# Patient Record
Sex: Female | Born: 1985 | Hispanic: No | Smoking: Never smoker
Health system: Southern US, Community
[De-identification: ages and names within clinical notes are randomized; demographics above are authoritative.]

## PROBLEM LIST (undated history)

## (undated) DIAGNOSIS — Z789 Other specified health status: Secondary | ICD-10-CM

## (undated) HISTORY — PX: WISDOM TOOTH EXTRACTION: SHX21

## (undated) HISTORY — PX: OTHER SURGICAL HISTORY: SHX169

---

## 2014-08-24 LAB — OB RESULTS CONSOLE HEPATITIS B SURFACE ANTIGEN
Hepatitis B Surface Ag: NEGATIVE
Hepatitis B Surface Ag: NEGATIVE

## 2014-08-24 LAB — OB RESULTS CONSOLE RUBELLA ANTIBODY, IGM: Rubella: IMMUNE

## 2014-08-24 LAB — OB RESULTS CONSOLE ABO/RH: RH Type: POSITIVE

## 2014-08-24 LAB — OB RESULTS CONSOLE ANTIBODY SCREEN: Antibody Screen: NEGATIVE

## 2014-08-24 LAB — OB RESULTS CONSOLE HIV ANTIBODY (ROUTINE TESTING): HIV: NONREACTIVE

## 2014-08-24 LAB — OB RESULTS CONSOLE GC/CHLAMYDIA: CHLAMYDIA, DNA PROBE: NEGATIVE

## 2014-08-25 LAB — OB RESULTS CONSOLE GC/CHLAMYDIA: Gonorrhea: NEGATIVE

## 2014-10-27 NOTE — L&D Delivery Note (Signed)
Delivery Note  First Stage: Labor onset: 0500 Augmentation: none Analgesia /Anesthesia intrapartum: water immersion SROM at 1211  Second Stage: Entered tub @1145 , water temp 100.0 Complete dilation at 1158 Onset of pushing at 1158 FHR second stage 120, cat I  In maternal hands and knees, delivery of a viable female at 68 by CNM in OA position in water No nuchal cord Cord double clamped after cessation of pulsation, cut by FOB Cord blood sample collected   Collection of cord blood donation n/a Arterial cord blood sample n/a  Third Stage: Placenta delivered via Tomasa Blase intact with 3 VC @ 1218 Placenta disposition: routine disposal Uterine tone firm / bleeding small  2nd degree perineal laceration identified  Anesthesia for repair: local Repaired with 2-0 Vicryl rapide Est. Blood Loss (mL): 200  Complications: none  Mom to postpartum.  Baby to Couplet care / Skin to Skin.  Newborn: Birth Weight: 7-15 Apgar Scores: 8/9 Feeding planned: breast  Donette Larry, N MSN, CNM 04/13/2015, 1:56 PM

## 2014-12-12 ENCOUNTER — Inpatient Hospital Stay (HOSPITAL_COMMUNITY): Admission: AD | Admit: 2014-12-12 | Payer: Self-pay | Source: Ambulatory Visit | Admitting: Obstetrics and Gynecology

## 2015-01-10 LAB — OB RESULTS CONSOLE RPR
RPR: NONREACTIVE
RPR: NONREACTIVE

## 2015-01-12 LAB — OB RESULTS CONSOLE RPR: RPR: NONREACTIVE

## 2015-03-06 LAB — OB RESULTS CONSOLE GBS: STREP GROUP B AG: NEGATIVE

## 2015-04-13 ENCOUNTER — Inpatient Hospital Stay (HOSPITAL_COMMUNITY)
Admission: AD | Admit: 2015-04-13 | Discharge: 2015-04-14 | DRG: 775 | Disposition: A | Discharge status: Home / Self Care | Source: Ambulatory Visit | Attending: Obstetrics and Gynecology | Admitting: Obstetrics and Gynecology

## 2015-04-13 ENCOUNTER — Encounter (HOSPITAL_COMMUNITY): Payer: Self-pay | Admitting: *Deleted

## 2015-04-13 DIAGNOSIS — O480 Post-term pregnancy: Secondary | ICD-10-CM | POA: Diagnosis present

## 2015-04-13 DIAGNOSIS — Z3A41 41 weeks gestation of pregnancy: Secondary | ICD-10-CM | POA: Diagnosis present

## 2015-04-13 HISTORY — DX: Other specified health status: Z78.9

## 2015-04-13 MED ORDER — MEASLES, MUMPS & RUBELLA VAC ~~LOC~~ INJ: 0.5000 mL | INJECTION | Freq: Once | SUBCUTANEOUS | Status: DC

## 2015-04-13 MED ORDER — OXYCODONE-ACETAMINOPHEN 5-325 MG PO TABS
2.0000 | ORAL_TABLET | ORAL | Status: DC | PRN
Start: 2015-04-13 — End: 2015-04-14

## 2015-04-13 MED ORDER — WITCH HAZEL-GLYCERIN EX PADS: 1.0000 "application " | MEDICATED_PAD | CUTANEOUS | Status: DC | PRN

## 2015-04-13 MED ORDER — ONDANSETRON HCL 4 MG/2ML IJ SOLN: 4.0000 mg | INTRAMUSCULAR | Status: DC | PRN

## 2015-04-13 MED ORDER — LANOLIN HYDROUS EX OINT: TOPICAL_OINTMENT | CUTANEOUS | Status: DC | PRN

## 2015-04-13 MED ORDER — ZOLPIDEM TARTRATE 5 MG PO TABS: 5.0000 mg | ORAL_TABLET | Freq: Every evening | ORAL | Status: DC | PRN

## 2015-04-13 MED ORDER — CITRIC ACID-SODIUM CITRATE 334-500 MG/5ML PO SOLN: 30.0000 mL | ORAL | Status: DC | PRN

## 2015-04-13 MED ORDER — LACTATED RINGERS IV SOLN: 500.0000 mL | INTRAVENOUS | Status: DC | PRN

## 2015-04-13 MED ORDER — OXYTOCIN 10 UNIT/ML IJ SOLN
INTRAMUSCULAR | Status: AC
  Administered 2015-04-13: 10 [IU]
  Filled 2015-04-13: qty 1

## 2015-04-13 MED ORDER — SENNOSIDES-DOCUSATE SODIUM 8.6-50 MG PO TABS
2.0000 | ORAL_TABLET | ORAL | Status: DC
  Filled 2015-04-13: qty 2

## 2015-04-13 MED ORDER — DIPHENHYDRAMINE HCL 25 MG PO CAPS: 25.0000 mg | ORAL_CAPSULE | Freq: Four times a day (QID) | ORAL | Status: DC | PRN

## 2015-04-13 MED ORDER — DIBUCAINE 1 % RE OINT: 1.0000 "application " | TOPICAL_OINTMENT | RECTAL | Status: DC | PRN

## 2015-04-13 MED ORDER — TETANUS-DIPHTH-ACELL PERTUSSIS 5-2.5-18.5 LF-MCG/0.5 IM SUSP: 0.5000 mL | Freq: Once | INTRAMUSCULAR | Status: DC

## 2015-04-13 MED ORDER — OXYTOCIN BOLUS FROM INFUSION: 500.0000 mL | INTRAVENOUS | Status: DC

## 2015-04-13 MED ORDER — PRENATAL MULTIVITAMIN CH
1.0000 | ORAL_TABLET | Freq: Every day | ORAL | Status: DC
  Filled 2015-04-13: qty 1

## 2015-04-13 MED ORDER — IBUPROFEN 600 MG PO TABS
600.0000 mg | ORAL_TABLET | Freq: Four times a day (QID) | ORAL | Status: DC
  Administered 2015-04-13 – 2015-04-14 (×3): 600 mg via ORAL
  Filled 2015-04-13 (×4): qty 1

## 2015-04-13 MED ORDER — ONDANSETRON HCL 4 MG PO TABS: 4.0000 mg | ORAL_TABLET | ORAL | Status: DC | PRN

## 2015-04-13 MED ORDER — ACETAMINOPHEN 325 MG PO TABS: 650.0000 mg | ORAL_TABLET | ORAL | Status: DC | PRN

## 2015-04-13 MED ORDER — SIMETHICONE 80 MG PO CHEW: 80.0000 mg | CHEWABLE_TABLET | ORAL | Status: DC | PRN

## 2015-04-13 MED ORDER — ONDANSETRON HCL 4 MG/2ML IJ SOLN: 4.0000 mg | Freq: Four times a day (QID) | INTRAMUSCULAR | Status: DC | PRN

## 2015-04-13 MED ORDER — LIDOCAINE HCL (PF) 1 % IJ SOLN
30.0000 mL | INTRAMUSCULAR | Status: DC | PRN
  Administered 2015-04-13: 30 mL via SUBCUTANEOUS
  Filled 2015-04-13: qty 30

## 2015-04-13 MED ORDER — BENZOCAINE-MENTHOL 20-0.5 % EX AERO
1.0000 "application " | INHALATION_SPRAY | CUTANEOUS | Status: DC | PRN
  Administered 2015-04-13: 1 via TOPICAL
  Filled 2015-04-13: qty 56

## 2015-04-13 MED ORDER — OXYTOCIN 40 UNITS IN LACTATED RINGERS INFUSION - SIMPLE MED: 62.5000 mL/h | INTRAVENOUS | Status: DC

## 2015-04-13 MED ORDER — OXYCODONE-ACETAMINOPHEN 5-325 MG PO TABS: 1.0000 | ORAL_TABLET | ORAL | Status: DC | PRN

## 2015-04-13 MED ORDER — OXYTOCIN 10 UNIT/ML IJ SOLN: 10.0000 [IU] | Freq: Once | INTRAMUSCULAR | Status: DC

## 2015-04-13 NOTE — Progress Notes (Signed)
In tub

## 2015-04-13 NOTE — MAU Note (Signed)
41+ wks, contractions started around 0500, contractions now about every 3 min.  Had some bloody show, no leaking. Was 2 cm on Tues.

## 2015-04-13 NOTE — H&P (Addendum)
  OB ADMISSION/ HISTORY & PHYSICAL:  Admission Date: 04/13/2015  9:45 AM  Admit Diagnosis: 41.[redacted] weeks gestation, active labor  Cynthia Briggs is a 29 y.o. female presenting for regular painful ctx since 0500.  Prenatal History: G2P1001   EDC:04/05/2015, Date entered prior to episode creation   Transfer of prenatal care at 14 wks to [facility]  Primary Ob Provider: Marlinda Mike, CNM Prenatal course complicated by post dates-normal AT, normal AFI, EFW 7-10, and IDA.   Prenatal Labs: ABO, Rh:   A Pos Antibody:  Neg Rubella:   Immune RPR:   NR HBsAg:   Neg HIV:   Neg GBS:   Neg 1 hr GTT: declined-home monitoring WNL  Medical / Surgical History :  Past medical history:  Past Medical History  Diagnosis Date  . Medical history non-contributory      Past surgical history:  Past Surgical History  Procedure Laterality Date  . Wart removed    . Wisdom tooth extraction      Family History: History reviewed. No pertinent family history.   Social History:  reports that she has never smoked. She has never used smokeless tobacco. She reports that she does not drink alcohol or use illicit drugs.  Allergies: Codeine   Current Medications at time of admission:  Prior to Admission medications   Medication Sig Start Date End Date Taking? Authorizing Provider  benzoyl peroxide 10 % gel Apply 1 application topically daily.   Yes Historical Provider, MD  prenatal vitamin w/FE, FA (PRENATAL 1 + 1) 27-1 MG TABS tablet Take 1 tablet by mouth daily at 12 noon.   Yes Historical Provider, MD     Review of Systems: +FM +ctx +bloody show +BMs No LOF  Physical Exam:  VS: Blood pressure 127/74, pulse 80, temperature 98 F (36.7 C), temperature source Oral, resp. rate 18, height 5' 4.5" (1.638 m), weight 77.565 kg (171 lb).  General: alert and oriented, appears moderately uncomfortable with ctx Heart: RRR Lungs: Clear lung fields Abdomen: Gravid, soft and  non-tender, non-distended / uterus: gravid, non-tender Extremities: No edema Genitalia / VE: Dilation: 4 Effacement (%): 90 Station: -2 Exam by:: K,. Weiss RN FHR: baseline rate 125 / variability mod / accelerations + / no decelerations TOCO: 3-4, mod  Assessment: 41.[redacted] weeks gestation First stage of labor-early active FHR category I  Plan:  Admit, intermittent EFM, water immersion when more active, labor support with doula, consider AROM, anticipate SVD. Dr. Billy Coast notified of admission / plan of care   Lawernce Pitts MSN, CNM 04/13/2015, 10:59 AM

## 2015-04-13 NOTE — Progress Notes (Signed)
Water birth, pt laborinig in BR now

## 2015-04-14 LAB — CBC
HEMATOCRIT: 30.6 % — AB (ref 36.0–46.0)
HEMOGLOBIN: 10.7 g/dL — AB (ref 12.0–15.0)
MCH: 31 pg (ref 26.0–34.0)
MCHC: 35 g/dL (ref 30.0–36.0)
MCV: 88.7 fL (ref 78.0–100.0)
Platelets: 177 10*3/uL (ref 150–400)
RBC: 3.45 MIL/uL — ABNORMAL LOW (ref 3.87–5.11)
RDW: 13 % (ref 11.5–15.5)
WBC: 9.5 10*3/uL (ref 4.0–10.5)

## 2015-04-14 NOTE — Discharge Instructions (Signed)
Ok to use OTC Tylenol and Motrin for discomfort and pain - recommend Motrin 3 tablets x 48 hr after DC then PRN

## 2015-04-14 NOTE — Progress Notes (Signed)
Called lab to inquire about RPR results, since I could not see RPR under pending results. When I got report this am, from Cynthia Briggs, she states that she added RPR onto CBC draw this am and lab said to cancel because one was already pending.  I spoke to lab and they say that both orders for RPR are cancelled and they cannot see one pending. They will look into it further and call me back if I need to order another RPR.

## 2015-04-14 NOTE — Progress Notes (Signed)
Spoke with Cynthia Briggs in lab both orders for RPR have been cancelled and they do not have any blood on hold in lab they can use to run RPR.  Need new order for RPR and will have to restick pt.

## 2015-04-14 NOTE — Progress Notes (Signed)
PPD 1 SVD  S:  Reports feeling well - desires early DC today             Tolerating po/ No nausea or vomiting             Bleeding is moderate             Pain controlled with motrin only - declines percocet use             Up ad lib / ambulatory / voiding QS  Newborn breast feeding  / female O:               VS: BP 120/62 mmHg  Pulse 59  Temp(Src) 98.2 F (36.8 C) (Oral)  Resp 18  Ht 5' 4.5" (1.638 m)  Wt 77.565 kg (171 lb)  BMI 28.91 kg/m2  Breastfeeding? Unknown   LABS:              Recent Labs  04/14/15 0552  WBC 9.5  HGB 10.7*  PLT 177               Blood type: A/Positive/-- (10/29 0000)  Rubella: Immune (10/29 0000)                     I&O: Intake/Output      06/17 0701 - 06/18 0700 06/18 0701 - 06/19 0700   Urine (mL/kg/hr) 0    Blood 125    Total Output 125     Net -125          Urine Occurrence 1 x                  Physical Exam:             Alert and oriented X3  Abdomen: soft, non-tender, non-distended              Fundus: firm, non-tender, U-1  Perineum: no edema  Lochia: light  Extremities: no edema, no calf pain or tenderness    A: PPD # 1 s/p waterbirth   Doing well - stable status  P: Routine post partum orders - DC home after 24hr PP  Draw RPR prior to discharge - patient aware of lab re-draw needed to meet state requirements of RPR              WOB booklet - instructions reviewed  Marlinda Mike CNM, MSN, [facility] 04/14/2015, 10:46 AM

## 2015-04-14 NOTE — Discharge Summary (Signed)
Obstetric Discharge Summary  Reason for Admission: onset of labor @ 41.1 weeks Prenatal Procedures: NST and ultrasound - post dates protocol Intrapartum Procedures: spontaneous vaginal delivery - water birth Postpartum Procedures: none Complications-Operative and Postpartum: 2nd degree perineal laceration HEMOGLOBIN  Date Value Ref Range Status  04/14/2015 10.7* 12.0 - 15.0 g/dL Final   HCT  Date Value Ref Range Status  04/14/2015 30.6* 36.0 - 46.0 % Final    Physical Exam:  General: alert, cooperative and no distress Lochia: appropriate Uterine Fundus: firm Incision: healing well DVT Evaluation: No evidence of DVT seen on physical exam.  Discharge Diagnoses: Term Pregnancy-delivered - water birth  Discharge Information: Date: 04/14/2015 Activity: pelvic rest Diet: routine Medications: PNV, Ibuprofen and Tylenol Condition: stable Instructions: refer to practice specific booklet Discharge to: home Follow-up Information    Follow up with Marlinda Mike, CNM. Schedule an appointment as soon as possible for a visit in 6 weeks.   Specialty:  Obstetrics and Gynecology   Contact information:   [address] [place] [address] [phone]       Newborn Data: Live born female  Birth Weight: 7 lb 15.5 oz (3615 g) APGAR: 8, 9  Home with mother.  Marlinda Mike 04/14/2015, 10:50 AM

## 2015-04-14 NOTE — Lactation Note (Signed)
This note was copied from the chart of Cynthia Briggs. Lactation Consultation Note  Initial visit made.  Breastfeeding consultation services and support information given and reviewed with patient.  Mom is experienced breastfeeding her first baby.  Newborn latched immediately after birth and has been nursing frequently and well.  Good latch and active nursing observed at this visit.  Reviewed basics.  Outpatient lactation services and support encouraged prn.  Patient Name: Cynthia Briggs [id number] Date: 04/14/2015 Reason for consult: Initial assessment   Maternal Data    Feeding Feeding Type: Breast Fed  LATCH Score/Interventions Latch: Grasps breast easily, tongue down, lips flanged, rhythmical sucking.  Audible Swallowing: A few with stimulation  Type of Nipple: Everted at rest and after stimulation  Comfort (Breast/Nipple): Soft / non-tender     Hold (Positioning): No assistance needed to correctly position infant at breast. Intervention(s): Breastfeeding basics reviewed;Support Pillows;Skin to skin  LATCH Score: 9  Lactation Tools Discussed/Used     Consult Status Consult Status: Complete    Huston Foley 04/14/2015, 12:26 PM

## 2015-04-15 LAB — RPR: RPR: NONREACTIVE

## 2017-05-25 ENCOUNTER — Other Ambulatory Visit (HOSPITAL_COMMUNITY): Payer: Self-pay

## 2017-05-25 DIAGNOSIS — Z3689 Encounter for other specified antenatal screening: Secondary | ICD-10-CM

## 2017-05-27 ENCOUNTER — Encounter (HOSPITAL_COMMUNITY): Payer: Self-pay | Admitting: *Deleted

## 2017-05-29 ENCOUNTER — Ambulatory Visit (HOSPITAL_COMMUNITY)
Admission: RE | Admit: 2017-05-29 | Discharge: 2017-05-29 | Disposition: A | Discharge status: Home / Self Care | Source: Ambulatory Visit

## 2017-05-29 ENCOUNTER — Encounter (HOSPITAL_COMMUNITY): Payer: Self-pay

## 2017-05-29 ENCOUNTER — Other Ambulatory Visit (HOSPITAL_COMMUNITY): Payer: Self-pay

## 2017-05-29 ENCOUNTER — Other Ambulatory Visit (HOSPITAL_COMMUNITY): Payer: Self-pay | Admitting: *Deleted

## 2017-05-29 DIAGNOSIS — O358XX0 Maternal care for other (suspected) fetal abnormality and damage, not applicable or unspecified: Secondary | ICD-10-CM

## 2017-05-29 DIAGNOSIS — Z3A24 24 weeks gestation of pregnancy: Secondary | ICD-10-CM

## 2017-05-29 DIAGNOSIS — Z3689 Encounter for other specified antenatal screening: Secondary | ICD-10-CM

## 2017-05-29 DIAGNOSIS — O359XX0 Maternal care for (suspected) fetal abnormality and damage, unspecified, not applicable or unspecified: Secondary | ICD-10-CM | POA: Diagnosis present

## 2017-05-29 DIAGNOSIS — O289 Unspecified abnormal findings on antenatal screening of mother: Secondary | ICD-10-CM | POA: Insufficient documentation

## 2017-05-29 DIAGNOSIS — O365910 Maternal care for other known or suspected poor fetal growth, first trimester, not applicable or unspecified: Secondary | ICD-10-CM

## 2017-05-29 DIAGNOSIS — O365990 Maternal care for other known or suspected poor fetal growth, unspecified trimester, not applicable or unspecified: Secondary | ICD-10-CM

## 2017-05-29 DIAGNOSIS — O35EXX0 Maternal care for other (suspected) fetal abnormality and damage, fetal genitourinary anomalies, not applicable or unspecified: Secondary | ICD-10-CM

## 2017-05-29 DIAGNOSIS — O365920 Maternal care for other known or suspected poor fetal growth, second trimester, not applicable or unspecified: Secondary | ICD-10-CM | POA: Insufficient documentation

## 2017-05-29 NOTE — Progress Notes (Signed)
Genetic Counseling  Visit Summary Note  Appointment Date: 05/29/2017 Referred By: Juliene Pina, CNM  Date of Birth: Apr 18, 1986  Pregnancy history: G3P2002 Estimated Date of Delivery: 09/17/17 Estimated Gestational Age: [redacted]w[redacted]d I met with Mrs. Cynthia Briggs her husband, Cynthia Briggs for genetic counseling because of abnormal ultrasound findings.  In summary:  Discussed ultrasound findings in detail  Reviewed options for additional screening  NIPS - will consider  Expanded carrier screening - will consider  Ongoing ultrasound - scheduled  Reviewed options for diagnostic testing, including risks, benefits, limitations and alternatives  Reviewed other explanations for ultrasound findings  Reviewed family history concerns - none reported  Cynthia Briggs had an anatomy ultrasound today which revealed fetal measurements that are consistently small, suggestive of intrauterine growth restriction (IUGR).  The long bones are all small, but show no evidence of fractures or bowing.  The bone echogenicity is normal.   There is no evidence of other fetal anomalies or soft markers.  They were counseled that IUGR can result from a variety of causes, including chronic uteroplacental insufficiency, environmental exposures (drugs, alcohol, and other teratogens), congenital infections, or genetic etiologies.  Cynthia Briggs reports no history of exposures, or medical concerns during the pregnancy.  We discussed that uteroplacental insufficiency or poor placentation is a common cause of IUGR.  When the growth restriction is due to nutritional compromise, the head growth tends to be spared, resulting in asymmetrical growth restriction.  Ultrasound showed that the fetal head circumference is also small.  Regarding genetic etiologies, we discussed the increased risk for specific chromosome conditions including fetal aneuploidy.  We reviewed chromosomes, nondisjunction, and the common features of Down  syndrome, trisomy 187 trisomy 142and triploidy.  In addition, we discussed the slight increase in risk for other chromosome aberrations (uniparental disomy, microdeletions, duplications, insertions, and translocations).      We discussed the options of testing for fetal chromosome conditions by way of amniocentesis, including the risks, benefits and limitations.  We also discussed the option of noninvasive prenatal screening (NIPS).  We discussed that NIPS analyzes cell free fetal DNA found in the maternal circulation and provides a pregnancy specific risk assessment for specific chromosome conditions, including: trisomy 244 trisomy 131 trisomy 168 triploidy, and sex chromosome aneuploidy.  This test is not diagnostic for chromosome conditions; however, the reported detection rate is greater than 99% for most conditions and the false positive rate is less than 0.1 for all of these conditions.  After thoughtful consideration of her options, [name]  She may pursue NIPS at a follow up visit.    We then discussed other possible explanations for the above discussed ultrasound findings including single gene conditions.   They were counseled that IUGR can be a feature in a variety of genetic conditions, but can also be a normal variant of growth or the result of familial short stature.  There are no known individuals on either side of the family with short stature or specific genetic conditions.  We discussed the option of expanded carrier screening. We reviewed that expanded carrier screen evaluates carrier status for a wide range of genetic conditions. Some of these conditions are severe and actionable, but also rare; others occur more commonly, but may be less severe. We discussed that this panel includes 175 autosomal recessive or X-linked recessive genetic conditions. We reviewed that the prevalence of each condition varies (and often varies with ethnicity).  Thus, an  individual's background risk to be  a carrier for each of these conditions would range, and in some cases be very low or unknown. Similarly, the detection rate varies with each condition depending upon the number of genes implicated in the condition, for which testing may be available.  Thus, even though the majority of the genes are sequenced which typically can detect 99% of mutations, screening may not be available for all genes implicated in each condition.    The detection rate varies in some cases with ethnicity, ranging from greater than 99% (in the case of hemoglobinopathies) to unknown. We reviewed that a negative carrier screen would thus reduce, but not eliminate the chance to be a carrier for these conditions.  We reviewed that in the event that one partner is found to be a carrier for one or more conditions, carrier screening would be available to the other partner for those conditions, or for the entire panel.  We discussed that some couples choose to have one partner tested first and others choose to test both members of the couple at the same time. Finally, we discussed that while typically there are only reproductive implications to a positive screen, we may diagnose an asymptomatic affected individual with two mutations.  Additionally, premutation carriers for Fragile X syndrome may have an increased risk for premature ovarian failure and/or fragile X associated tremor and ataxia syndrome.  Thus, carriers could have clinical symptoms.  We discussed the possible results that the tests might provide including: positive, negative or unanticipated/unclear. Finally, she was counseled regarding the cost of the screening options and potential out of pocket expenses.  We discussed that we would provide insurance information to the referring laboratory. She will receive either a text message, an email or both from the referring laboratory regarding an estimate of the out of pocket expenses.  If they are  not comfortable with what that cost is, they can discuss payment options with the lab directly or cancel the test.  Cynthia Briggs will consider this option and let us know at a follow up visit if she wishes to pursue expanded carrier screening.  Both family histories were reviewed and found to be noncontributory for birth defects, mental retardation, and known genetic conditions. Without further information regarding the provided family history, an accurate genetic risk cannot be calculated. Further genetic counseling is warranted if more information is obtained.  I counseled this couple regarding the above risks and available options.  The approximate face-to-face time with the genetic counselor was 60 minutes.   Cam Hai, MS Certified Genetic Counselor

## 2017-06-05 ENCOUNTER — Encounter (HOSPITAL_COMMUNITY): Payer: Self-pay

## 2017-06-05 ENCOUNTER — Ambulatory Visit (HOSPITAL_COMMUNITY)
Admission: RE | Admit: 2017-06-05 | Discharge: 2017-06-05 | Disposition: A | Discharge status: Home / Self Care | Source: Ambulatory Visit

## 2017-06-05 DIAGNOSIS — O365920 Maternal care for other known or suspected poor fetal growth, second trimester, not applicable or unspecified: Secondary | ICD-10-CM | POA: Insufficient documentation

## 2017-06-05 DIAGNOSIS — Z3A25 25 weeks gestation of pregnancy: Secondary | ICD-10-CM | POA: Insufficient documentation

## 2017-06-05 DIAGNOSIS — O365990 Maternal care for other known or suspected poor fetal growth, unspecified trimester, not applicable or unspecified: Secondary | ICD-10-CM

## 2017-06-12 ENCOUNTER — Ambulatory Visit (HOSPITAL_COMMUNITY)
Admission: RE | Admit: 2017-06-12 | Discharge: 2017-06-12 | Disposition: A | Discharge status: Home / Self Care | Source: Ambulatory Visit

## 2017-06-12 ENCOUNTER — Encounter (HOSPITAL_COMMUNITY): Payer: Self-pay

## 2017-06-12 DIAGNOSIS — O365990 Maternal care for other known or suspected poor fetal growth, unspecified trimester, not applicable or unspecified: Secondary | ICD-10-CM

## 2017-06-12 DIAGNOSIS — O321XX0 Maternal care for breech presentation, not applicable or unspecified: Secondary | ICD-10-CM | POA: Diagnosis not present

## 2017-06-12 DIAGNOSIS — O365930 Maternal care for other known or suspected poor fetal growth, third trimester, not applicable or unspecified: Secondary | ICD-10-CM | POA: Diagnosis present

## 2017-06-12 DIAGNOSIS — Z3A26 26 weeks gestation of pregnancy: Secondary | ICD-10-CM | POA: Diagnosis not present

## 2017-06-19 ENCOUNTER — Encounter (HOSPITAL_COMMUNITY): Payer: Self-pay

## 2017-06-19 ENCOUNTER — Ambulatory Visit (HOSPITAL_COMMUNITY)
Admission: RE | Admit: 2017-06-19 | Discharge: 2017-06-19 | Disposition: A | Discharge status: Home / Self Care | Source: Ambulatory Visit

## 2017-06-19 DIAGNOSIS — O358XX0 Maternal care for other (suspected) fetal abnormality and damage, not applicable or unspecified: Secondary | ICD-10-CM | POA: Diagnosis not present

## 2017-06-19 DIAGNOSIS — O321XX0 Maternal care for breech presentation, not applicable or unspecified: Secondary | ICD-10-CM | POA: Insufficient documentation

## 2017-06-19 DIAGNOSIS — O365920 Maternal care for other known or suspected poor fetal growth, second trimester, not applicable or unspecified: Secondary | ICD-10-CM | POA: Diagnosis not present

## 2017-06-19 DIAGNOSIS — O365990 Maternal care for other known or suspected poor fetal growth, unspecified trimester, not applicable or unspecified: Secondary | ICD-10-CM

## 2017-06-19 DIAGNOSIS — Z3A27 27 weeks gestation of pregnancy: Secondary | ICD-10-CM | POA: Diagnosis not present

## 2017-06-20 LAB — CMV ANTIBODY, IGG (EIA): CMV Ab - IgG: 8.8 U/mL — ABNORMAL HIGH (ref 0.00–0.59)

## 2017-06-20 LAB — CMV IGM

## 2017-06-22 ENCOUNTER — Other Ambulatory Visit (HOSPITAL_COMMUNITY): Payer: Self-pay | Admitting: *Deleted

## 2017-06-22 DIAGNOSIS — O365930 Maternal care for other known or suspected poor fetal growth, third trimester, not applicable or unspecified: Secondary | ICD-10-CM

## 2017-06-26 ENCOUNTER — Encounter (HOSPITAL_COMMUNITY): Payer: Self-pay

## 2017-06-26 ENCOUNTER — Ambulatory Visit (HOSPITAL_COMMUNITY)
Admission: RE | Admit: 2017-06-26 | Discharge: 2017-06-26 | Disposition: A | Discharge status: Home / Self Care | Source: Ambulatory Visit

## 2017-06-26 ENCOUNTER — Other Ambulatory Visit (HOSPITAL_COMMUNITY): Payer: Self-pay | Admitting: Maternal & Fetal Medicine

## 2017-06-26 DIAGNOSIS — O365930 Maternal care for other known or suspected poor fetal growth, third trimester, not applicable or unspecified: Secondary | ICD-10-CM | POA: Diagnosis present

## 2017-06-26 DIAGNOSIS — Z3A28 28 weeks gestation of pregnancy: Secondary | ICD-10-CM | POA: Diagnosis not present

## 2017-06-26 DIAGNOSIS — O359XX0 Maternal care for (suspected) fetal abnormality and damage, unspecified, not applicable or unspecified: Secondary | ICD-10-CM | POA: Diagnosis not present

## 2017-06-30 ENCOUNTER — Telehealth (HOSPITAL_COMMUNITY): Payer: Self-pay | Admitting: MS"

## 2017-06-30 NOTE — Telephone Encounter (Signed)
Attempted to contact patient regarding results of noninvasive prenatal screening (NIPS)/prenatal cell free DNA testing ([facility]), which are within normal limits. Left message for patient to return call.   Clydie BraunKaren Quamir Willemsen 06/30/2017 11:51 AM

## 2017-07-01 ENCOUNTER — Telehealth (HOSPITAL_COMMUNITY): Payer: Self-pay | Admitting: Genetics

## 2017-07-01 NOTE — Telephone Encounter (Signed)
Called Ms. Diener to review the results of her NIPS.  There was no answer, I left a message with the direct phone number for her to return my call.

## 2017-07-02 ENCOUNTER — Telehealth (HOSPITAL_COMMUNITY): Payer: Self-pay | Admitting: MS"

## 2017-07-02 NOTE — Telephone Encounter (Signed)
Called Cynthia Briggs to discuss her prenatal cell free DNA test results.  Mrs. Cynthia Briggs had PaRozanna Boxnorama testing through [facility].  Testing was offered because of ultrasound findings.   The patient was identified by name and DOB.  We reviewed that these are within normal limits, showing a less than 1 in 10,000 risk for trisomies 21, 18 and 13, and monosomy X (Turner syndrome).  In addition, the risk for triploidy and sex chromosome trisomies (47,XXX and 47,XXY) was also low risk.  Analysis for microdeletion syndrome panel (22q11.2 deletion, 1p36 deletion, Angelman syndrome, Prader-Willi syndrome, and cri-du-chat syndrome) were also within normal limits.   We reviewed that this testing identifies > 99% of pregnancies with trisomy 4221, trisomy 1413, sex chromosome trisomies (47,XXX and 47,XXY), and triploidy. The detection rate for trisomy 18 is 96%.  The detection rate for monosomy X is ~92%.  The false positive rate is <0.1% for all conditions. Testing was also consistent with female fetal sex.  She understands that this testing does not identify all genetic conditions.  All questions were answered to her satisfaction, she was encouraged to call with additional questions or concerns.  Quinn PlowmanKaren Camarion Weier, MS Certified Genetic Counselor 07/02/2017 8:56 AM

## 2017-07-03 ENCOUNTER — Ambulatory Visit (HOSPITAL_COMMUNITY)
Admission: RE | Admit: 2017-07-03 | Discharge: 2017-07-03 | Disposition: A | Discharge status: Home / Self Care | Source: Ambulatory Visit

## 2017-07-03 ENCOUNTER — Encounter (HOSPITAL_COMMUNITY): Payer: Self-pay

## 2017-07-03 DIAGNOSIS — O26843 Uterine size-date discrepancy, third trimester: Secondary | ICD-10-CM | POA: Diagnosis present

## 2017-07-03 DIAGNOSIS — Z3A29 29 weeks gestation of pregnancy: Secondary | ICD-10-CM | POA: Diagnosis present

## 2017-07-03 DIAGNOSIS — O365930 Maternal care for other known or suspected poor fetal growth, third trimester, not applicable or unspecified: Secondary | ICD-10-CM | POA: Diagnosis present

## 2017-07-03 DIAGNOSIS — O321XX0 Maternal care for breech presentation, not applicable or unspecified: Secondary | ICD-10-CM | POA: Insufficient documentation

## 2017-07-03 DIAGNOSIS — O359XX0 Maternal care for (suspected) fetal abnormality and damage, unspecified, not applicable or unspecified: Secondary | ICD-10-CM | POA: Insufficient documentation

## 2017-07-07 ENCOUNTER — Other Ambulatory Visit: Payer: Self-pay

## 2017-07-10 ENCOUNTER — Inpatient Hospital Stay (HOSPITAL_COMMUNITY): Admission: RE | Admit: 2017-07-10 | Source: Ambulatory Visit

## 2017-07-14 ENCOUNTER — Other Ambulatory Visit (HOSPITAL_COMMUNITY): Payer: Self-pay | Admitting: Maternal & Fetal Medicine

## 2017-07-14 ENCOUNTER — Ambulatory Visit (HOSPITAL_COMMUNITY)
Admission: RE | Admit: 2017-07-14 | Discharge: 2017-07-14 | Disposition: A | Discharge status: Home / Self Care | Source: Ambulatory Visit | Attending: Maternal & Fetal Medicine | Admitting: Maternal & Fetal Medicine

## 2017-07-14 ENCOUNTER — Other Ambulatory Visit (HOSPITAL_COMMUNITY): Payer: Self-pay | Admitting: *Deleted

## 2017-07-14 ENCOUNTER — Encounter (HOSPITAL_COMMUNITY): Payer: Self-pay

## 2017-07-14 DIAGNOSIS — Z3A30 30 weeks gestation of pregnancy: Secondary | ICD-10-CM | POA: Diagnosis not present

## 2017-07-14 DIAGNOSIS — O365930 Maternal care for other known or suspected poor fetal growth, third trimester, not applicable or unspecified: Secondary | ICD-10-CM | POA: Diagnosis not present

## 2017-07-14 MED ORDER — BETAMETHASONE SOD PHOS & ACET 6 (3-3) MG/ML IJ SUSP
12.0000 mg | INTRAMUSCULAR | Status: DC
  Administered 2017-07-14: 12 mg via INTRAMUSCULAR
  Filled 2017-07-14: qty 2

## 2017-07-15 ENCOUNTER — Ambulatory Visit (HOSPITAL_COMMUNITY)
Admission: RE | Admit: 2017-07-15 | Discharge: 2017-07-15 | Disposition: A | Discharge status: Home / Self Care | Source: Ambulatory Visit

## 2017-07-15 ENCOUNTER — Ambulatory Visit (HOSPITAL_COMMUNITY)

## 2017-07-15 DIAGNOSIS — Z3A30 30 weeks gestation of pregnancy: Secondary | ICD-10-CM | POA: Insufficient documentation

## 2017-07-15 DIAGNOSIS — O365930 Maternal care for other known or suspected poor fetal growth, third trimester, not applicable or unspecified: Secondary | ICD-10-CM | POA: Diagnosis present

## 2017-07-15 MED ORDER — BETAMETHASONE SOD PHOS & ACET 6 (3-3) MG/ML IJ SUSP
12.0000 mg | Freq: Once | INTRAMUSCULAR | Status: AC
  Administered 2017-07-15: 12 mg via INTRAMUSCULAR
  Filled 2017-07-15: qty 2

## 2017-07-15 NOTE — Addendum Note (Signed)
Encounter addended by: Lenoard Aden, RDMS on: 07/15/2017  3:37 PM<BR>    Actions taken: Imaging Exam ended

## 2017-07-16 ENCOUNTER — Other Ambulatory Visit (HOSPITAL_COMMUNITY): Payer: Self-pay | Admitting: *Deleted

## 2017-07-16 DIAGNOSIS — O365930 Maternal care for other known or suspected poor fetal growth, third trimester, not applicable or unspecified: Secondary | ICD-10-CM

## 2017-07-17 ENCOUNTER — Encounter (HOSPITAL_COMMUNITY): Payer: Self-pay

## 2017-07-17 ENCOUNTER — Ambulatory Visit (HOSPITAL_COMMUNITY)
Admission: RE | Admit: 2017-07-17 | Discharge: 2017-07-17 | Disposition: A | Discharge status: Home / Self Care | Source: Ambulatory Visit

## 2017-07-17 DIAGNOSIS — Z3A31 31 weeks gestation of pregnancy: Secondary | ICD-10-CM | POA: Insufficient documentation

## 2017-07-17 DIAGNOSIS — O365930 Maternal care for other known or suspected poor fetal growth, third trimester, not applicable or unspecified: Secondary | ICD-10-CM | POA: Diagnosis not present

## 2017-07-21 ENCOUNTER — Encounter (HOSPITAL_COMMUNITY): Payer: Self-pay

## 2017-07-21 ENCOUNTER — Other Ambulatory Visit (HOSPITAL_COMMUNITY): Payer: Self-pay | Admitting: Obstetrics and Gynecology

## 2017-07-21 ENCOUNTER — Ambulatory Visit (HOSPITAL_COMMUNITY)
Admission: RE | Admit: 2017-07-21 | Discharge: 2017-07-21 | Disposition: A | Discharge status: Home / Self Care | Source: Ambulatory Visit

## 2017-07-21 ENCOUNTER — Other Ambulatory Visit (HOSPITAL_COMMUNITY): Payer: Self-pay | Admitting: *Deleted

## 2017-07-21 DIAGNOSIS — O365930 Maternal care for other known or suspected poor fetal growth, third trimester, not applicable or unspecified: Secondary | ICD-10-CM | POA: Insufficient documentation

## 2017-07-21 DIAGNOSIS — O321XX0 Maternal care for breech presentation, not applicable or unspecified: Secondary | ICD-10-CM | POA: Diagnosis present

## 2017-07-21 DIAGNOSIS — Z3A31 31 weeks gestation of pregnancy: Secondary | ICD-10-CM

## 2017-07-21 DIAGNOSIS — Z3A32 32 weeks gestation of pregnancy: Secondary | ICD-10-CM

## 2017-07-21 DIAGNOSIS — O365990 Maternal care for other known or suspected poor fetal growth, unspecified trimester, not applicable or unspecified: Secondary | ICD-10-CM

## 2017-07-21 DIAGNOSIS — O289 Unspecified abnormal findings on antenatal screening of mother: Secondary | ICD-10-CM

## 2017-07-21 NOTE — Procedures (Signed)
Cynthia Briggs 08-22-1986 [redacted]w[redacted]d  Fetus A Non-Stress Test Interpretation for 07/21/17  Indication: Unsatisfactory BPP  Fetal Heart Rate A Mode: External Baseline Rate (A): 135 bpm Variability: Moderate Accelerations: 10 x 10, 15 x 15 Decelerations: None Multiple birth?: No  Uterine Activity Mode: Palpation, Toco Contraction Frequency (min): none  Interpretation (Fetal Testing) Nonstress Test Interpretation: Reactive Overall Impression: Reassuring for gestational age Comments: Reviewed tracing with Dr. Otho Perl

## 2017-07-24 ENCOUNTER — Ambulatory Visit (HOSPITAL_COMMUNITY)
Admission: RE | Admit: 2017-07-24 | Discharge: 2017-07-24 | Disposition: A | Discharge status: Home / Self Care | Source: Ambulatory Visit

## 2017-07-24 ENCOUNTER — Encounter (HOSPITAL_COMMUNITY): Payer: Self-pay

## 2017-07-24 ENCOUNTER — Inpatient Hospital Stay (HOSPITAL_COMMUNITY)
Admission: AD | Admit: 2017-07-24 | Discharge: 2017-08-07 | DRG: 788 | Disposition: A | Discharge status: Home / Self Care | Source: Ambulatory Visit | Attending: Obstetrics and Gynecology | Admitting: Obstetrics and Gynecology

## 2017-07-24 ENCOUNTER — Other Ambulatory Visit (HOSPITAL_COMMUNITY): Payer: Self-pay | Admitting: Obstetrics and Gynecology

## 2017-07-24 DIAGNOSIS — O365930 Maternal care for other known or suspected poor fetal growth, third trimester, not applicable or unspecified: Secondary | ICD-10-CM | POA: Insufficient documentation

## 2017-07-24 DIAGNOSIS — Z3A32 32 weeks gestation of pregnancy: Secondary | ICD-10-CM

## 2017-07-24 DIAGNOSIS — O365990 Maternal care for other known or suspected poor fetal growth, unspecified trimester, not applicable or unspecified: Secondary | ICD-10-CM | POA: Diagnosis present

## 2017-07-24 DIAGNOSIS — Z3A33 33 weeks gestation of pregnancy: Secondary | ICD-10-CM

## 2017-07-24 DIAGNOSIS — O365931 Maternal care for other known or suspected poor fetal growth, third trimester, fetus 1: Secondary | ICD-10-CM

## 2017-07-24 DIAGNOSIS — O321XX0 Maternal care for breech presentation, not applicable or unspecified: Secondary | ICD-10-CM | POA: Insufficient documentation

## 2017-07-24 LAB — URINALYSIS, ROUTINE W REFLEX MICROSCOPIC
Bilirubin Urine: NEGATIVE
GLUCOSE, UA: NEGATIVE mg/dL
Hgb urine dipstick: NEGATIVE
KETONES UR: NEGATIVE mg/dL
LEUKOCYTES UA: NEGATIVE
Nitrite: NEGATIVE
PH: 7 (ref 5.0–8.0)
Protein, ur: NEGATIVE mg/dL
SPECIFIC GRAVITY, URINE: 1.003 — AB (ref 1.005–1.030)

## 2017-07-24 LAB — CBC
HEMATOCRIT: 39.6 % (ref 36.0–46.0)
Hemoglobin: 14 g/dL (ref 12.0–15.0)
MCH: 31.7 pg (ref 26.0–34.0)
MCHC: 35.4 g/dL (ref 30.0–36.0)
MCV: 89.8 fL (ref 78.0–100.0)
Platelets: 274 10*3/uL (ref 150–400)
RBC: 4.41 MIL/uL (ref 3.87–5.11)
RDW: 13.1 % (ref 11.5–15.5)
WBC: 9.6 10*3/uL (ref 4.0–10.5)

## 2017-07-24 LAB — TYPE AND SCREEN
ABO/RH(D): A POS
ANTIBODY SCREEN: NEGATIVE

## 2017-07-24 LAB — ABO/RH: ABO/RH(D): A POS

## 2017-07-24 MED ORDER — ACETAMINOPHEN 325 MG PO TABS: 650.0000 mg | ORAL_TABLET | ORAL | Status: DC | PRN

## 2017-07-24 MED ORDER — PRENATAL MULTIVITAMIN CH
1.0000 | ORAL_TABLET | Freq: Every day | ORAL | Status: DC
  Filled 2017-07-24 (×3): qty 1

## 2017-07-24 MED ORDER — LACTATED RINGERS IV SOLN
INTRAVENOUS | Status: DC
  Administered 2017-07-24: 15:00:00 via INTRAVENOUS

## 2017-07-24 MED ORDER — CALCIUM CARBONATE ANTACID 500 MG PO CHEW: 2.0000 | CHEWABLE_TABLET | ORAL | Status: DC | PRN

## 2017-07-24 MED ORDER — DOCUSATE SODIUM 100 MG PO CAPS
100.0000 mg | ORAL_CAPSULE | Freq: Every day | ORAL | Status: DC
  Filled 2017-07-24 (×4): qty 1

## 2017-07-24 MED ORDER — ZOLPIDEM TARTRATE 5 MG PO TABS: 5.0000 mg | ORAL_TABLET | Freq: Every evening | ORAL | Status: DC | PRN

## 2017-07-24 NOTE — H&P (Signed)
NAME:  Cynthia Briggs, Cynthia Briggs NO.:  [id number]  MEDICAL RECORD NO.:  [id number]  LOCATION:                                 FACILITY:  PHYSICIAN:  Lenoard Aden, M.D.     DATE OF BIRTH:  DATE OF ADMISSION: DATE OF DISCHARGE:                             HISTORY & PHYSICAL   ADMISSION DIAGNOSIS:  Intrauterine growth restriction with abnormal Doppler studies.  HISTORY OF PRESENT ILLNESS:  A 31 year old white female, G3, P2, at 47 and 1/7 weeks' gestation for admission for severe IUGR with abnormal Dopplers.  MEDICATIONS: includes probiotic, prenatal vitamins.  ALLERGIES: codeine.  FAMILY HISTORY:  Autism, lung cancer.  SOCIAL HISTORY:  She is a nonsmoker, nondrinker.  Denies domestic or physical violence.  PAST MEDICAL HISTORY:  She has a history of 2 uncomplicated term vaginal deliveries.  SURGICAL HISTORY:  Wisdom tooth removal.  Current pregnancy complicated by severe IUGR with abnormal Doppler studies.  Most recent studies today reveal a biophysical profile of 8/8. Reactive NST.  Doppler flow studies consistent with absent end-diastolic flow and intermittent reversal of flow.  Recommendations by MFM for admission at this time.  Last EFM done 9/18 924gm  BPP today 8/8 Breech presentation  PHYSICAL EXAMINATION:  BP 121/72 (BP Location: Right Arm)   Pulse 73   Temp 98.1 F (36.7 C) (Oral)   Resp 15   Ht  (1.676 m)   Wt 71.7 kg (158 lb)   LMP 12/11/2016   SpO2 100%   BMI 25.50 kg/m   GENERAL:  She is a well-developed, well-nourished white female, in no acute distress. HEENT:  Normal. NECK:  Supple.  Full range of motion. LUNGS:  Clear. HEART:  Regular rate and rhythm. ABDOMEN:  Soft, gravid, nontender.  Cervical exam is deferred. EXTREMITIES:  There are no cords. NEUROLOGIC:  Nonfocal. SKIN:  Intact.  CBC    Component Value Date/Time   WBC 9.6 07/24/2017 1248   RBC 4.41 07/24/2017 1248   HGB 14.0 07/24/2017 1248   HCT 39.6  07/24/2017 1248   PLT 274 07/24/2017 1248   MCV 89.8 07/24/2017 1248   MCH 31.7 07/24/2017 1248   MCHC 35.4 07/24/2017 1248   RDW 13.1 07/24/2017 1248    IMPRESSION: 1. 32-week and 1/7th day intrauterine pregnancy. 2. Severe intrauterine growth restriction with abnormal Dopplers. 3. Breech presentation  PLAN:   Admit.   NST 3 times a day daily.   Biophysical profile with Doppler studies daily.  Betamethasone completed on 09/18 and 09/19. Neonatology consult ordered.   Bedrest with bathroom privileges. Deliver for fetal indications or by 34 weeks. Inpt care until delivery     Lenoard Aden, M.D.     RJT/MEDQ  D:  07/24/2017  T:  07/24/2017  Job:  [id number]

## 2017-07-24 NOTE — Progress Notes (Signed)
Patient ID: Cynthia Briggs, female   DOB: 11-08-1985, 31 y.o.   MRN: [id number] Admitted for IUGR now with intermittent reversal of EDF on UAD. BPP 10/10 BP 129/79   Pulse 62   Temp 98.5 F (36.9 C) (Oral)   LMP 12/11/2016   Reactive NST today Last fetal growth 9/18- 924gm Neonatology consult pending See H/P for details. Prefers cesarean section as route of delivery. Fetal presentation breech today.

## 2017-07-24 NOTE — MAU Note (Signed)
Patient presents from MFM for evaluation

## 2017-07-24 NOTE — ED Notes (Signed)
Report given to Val Eagle, RN per Dr. Clarisa Fling in [facility].  Pt and FOB ambulated and signed into MAU for further monitoring.

## 2017-07-24 NOTE — Progress Notes (Signed)
Patient ID: Cynthia Briggs, female   DOB: 01/10/1986, 31 y.o.   MRN: [id number] Good FM BP 121/72 (BP Location: Right Arm)   Pulse 73   Temp 98.1 F (36.7 C) (Oral)   Resp 15   Ht  (1.676 m)   Wt 71.7 kg (158 lb)   LMP 12/11/2016   SpO2 100%   BMI 25.50 kg/m   Reactive NST this pm.

## 2017-07-25 ENCOUNTER — Inpatient Hospital Stay (HOSPITAL_COMMUNITY)

## 2017-07-25 LAB — RPR: RPR Ser Ql: NONREACTIVE

## 2017-07-25 NOTE — Progress Notes (Signed)
Dr Juliene Pina in dept & updated on pt status, fetal tracing reviewed.  Orders clarified.

## 2017-07-25 NOTE — Progress Notes (Signed)
Patient ID: Cynthia Briggs, female   DOB: April 08, 1986, 31 y.o.   MRN: [id number] 32.2 wks. HD#2.  Severe IUGR, Abn Dopplers with absent EDF with intermittent reversed EDF Breech.  Plan for C/section.   S: No complaints. Feels good FMs O: BP 117/72 (BP Location: Right Arm)   Pulse 60   Temp 99.2 F (37.3 C) (Oral)   Resp 16   Ht  (1.676 m)   Wt 158 lb (71.7 kg)   LMP 12/11/2016   SpO2 100%   BMI 25.50 kg/m  A&O x3 Abdo soft gravid uterus S<D Extr no c/c/e  FHT- NST for 1 hr every 7-8 hrs per day. AM tracing - 140s/ + accels no decels/ mod variab- reactive No UCs  BPP this AM  Dopplers this AM  A/P: Severe IUGR baby with breech presentation, admitted for close monitoring since abnormal Dopplers.  S/p BTMZ Plan delivery with any fetal indications NICU consult, tour if desired  Plan reviewed with patient, agrees.   V.Myson Levi, MD

## 2017-07-25 NOTE — Progress Notes (Signed)
Patient ID: Cynthia Briggs, female   DOB: [birth year], 31 y.o.   MRN: [id number] FHT reviewed Baseline same 130s/ accels noted/ one varaible decel down to 70 and back to baseline in 1 minute. Moderate variability maintained again.  RN advised, watch closely. CEFM. Overall status reassuring, no immediate intervention needed  V.Jaelynn Currier MD

## 2017-07-25 NOTE — Progress Notes (Signed)
Patient ID: Cynthia Briggs, female   DOB: 10-Jan-1986, 31 y.o.   MRN: [id number]  BPP/ Doppler reviewed and RN informed to change to CEFM. Ok for BRP and showers.   " BPP 6/8 (0 for breathing), NST reactive. UA Doppler studies are abnormal with S/D >2SD above the mean.  In some waveforms there is intermittent absent end diastolic flow and intermittent reversed diastolic flow ---------------------------------------------------------------------- Recommendations by MFM:  continuous fetal monitoring. If fetal heart tracing is nonreassuring then delivery is  warranted. Repeat UA Dopplers/BPP tomorrow with CEFM in the interim. Decision to deliver should be based on either further deterioration of UA Dopplers or fetal heart tracing (ie, delivery for recurrent decels/absent variability)"

## 2017-07-25 NOTE — Progress Notes (Signed)
Dr Juliene Pina called & report given re: BPP results.  Orders rec'd.

## 2017-07-26 ENCOUNTER — Inpatient Hospital Stay (HOSPITAL_COMMUNITY)

## 2017-07-26 MED ORDER — SODIUM CHLORIDE 0.9% FLUSH
3.0000 mL | Freq: Three times a day (TID) | INTRAVENOUS | Status: DC
  Administered 2017-07-27 (×2): 3 mL via INTRAVENOUS

## 2017-07-26 NOTE — Plan of Care (Signed)
Problem: Education: Goal: Knowledge of disease or condition will improve Outcome: Progressing Pt continues to ask appropriate questions re: fetal health & is very engaged in care. Goal: Knowledge of the prescribed therapeutic regimen will improve Outcome: Progressing Pt aware of plan of care.

## 2017-07-26 NOTE — Progress Notes (Signed)
Dr Juliene Pina called for report on the BPP/doppler results.  Results given & report given on reassuring FHR.  orders rec'd for pt to have one w/c ride off unit to go outside for lunch.

## 2017-07-26 NOTE — Progress Notes (Signed)
To us via wc.

## 2017-07-26 NOTE — Progress Notes (Addendum)
Patient ID: Cynthia Briggs, female   DOB: February 06, 1986, 31 y.o.   MRN: [id number] 32.3 wks. HD#3  Severe IUGR, Abn Dopplers with absent EDF with intermittent reversed EDF Breech.  Plan for C/section.   S: No complaints. Feels good FMs O: BP 113/73 (BP Location: Left Arm)   Pulse 64   Temp 98.3 F (36.8 C) (Oral)   Resp 16   Ht  (1.676 m)   Wt 158 lb (71.7 kg)   LMP 12/11/2016   SpO2 100%   BMI 25.50 kg/m  A&O x3 Abdo soft gravid uterus S<D Extr no c/c/e  FHT-continuous since 9/29. 140s + accels, 2 decels of 1 minute each overnight, non-repetitive.  No UCs  BPP / Dopplers this AM -pending  A/P: Severe IUGR baby with breech presentation, admitted for close monitoring since abnormal Dopplers.  S/p BTMZ Plan delivery with any fetal indications NICU consult and tour  Plan reviewed with patient, agrees.   V.Mody, MD  Today's BPP/ Doppler: MFM- Dr Marjo Bicker "Impression  SIUP at 105w3d  active singleton fetus  transverse fetal lie  BPP 8/8  UA Doppler studies are abnormal with AEDF (absent end  diastolic flow).  In some waveforms there is intermittent  reversed diastolic flow ---------------------------------------------------------------------- Recommendations  In setting of abnormal fetal-placental blood flow, continuous  EFM is indicated between now and delivery.  Given persistent AEDF with intermittent reversed flow,  delivery should be entertained 32-34 weeks (ie, delivery  could be pursued now if desired but may be deferred until  deterioration of fetal heart tracing, onset of persistent  reversed UA diastolic flow, or 34 weeks whichever comes  first).  While BPP is reassuring, the fetal heart tracing should be  continuously reviewed by in house OB provider, noting that  delivery is indicated if the fetal heart tracing is nonreassuring  (ie, remote read of ultrasound only with interpretation of fetal  heart tracing deferred to [facility]  provider). ----------------------------------------------------------------------   MFM recommendation discussed with pt. Will confirm plan of care with MFM.               Durwin Nora, MD"

## 2017-07-26 NOTE — Progress Notes (Signed)
EFM disconnected for pt to have w/c ride to patio for dinner with family.

## 2017-07-27 ENCOUNTER — Inpatient Hospital Stay (HOSPITAL_COMMUNITY)

## 2017-07-27 NOTE — Consult Note (Signed)
Neonatology Consult  Note:  At the request of the patients obstetrician Dr. Ronita Hipps I met with Cynthia Briggs who is currently 32 [redacted] weeks pregnant with pregnancy complicated by intrauterine growth restriction with abnormal doppler studies.  Recent study 9/28 revealed a biophysical profile of 8/8.  Reactive NST.  Doppler flow studies consistent with absent end-diastolic flow and intermittent reversal of flow.  She received betamethasone 9/18-19 and is being managed with NST 3 times a day daily, Biophysical profile with Doppler studies daily and bedrest.  Current plan to deliver for fetal indications or by 34 weeks.  We reviewed initial delivery room management, including CPAP, [place], and low but certainly possible need for intubation for surfactant administration.  We discussed the need for possible umbilical central line placement as well as TPN administration depending on birth weight and clinical condition.  We discussed growth challenges in the setting of IUGR and fortification of breast milk feeds and the availability of donor breast milk if needed. In addition, we talked about feeding immaturity and need for full po intake with multiple days of good weight gain and no apnea or bradycardia before discharge.  We reviewed increased risk of jaundice, infection, and temperature instability.   Discussed likely length of stay.  Thank you for allowing [place] to participate in her care.  Please call with questions.  Higinio Roger, DO  Neonatologist   The total length of face-to-face or floor / unit time for this encounter was 25 minutes.  Counseling and / or coordination of care was greater than fifty percent of the time.

## 2017-07-27 NOTE — Progress Notes (Signed)
Pt. And husband escorted to NICU for tour with charge RN.

## 2017-07-27 NOTE — Progress Notes (Signed)
Patient ID: Cynthia Briggs, female   DOB: 1986/03/08, 31 y.o.   MRN: [id number] HD #4 Severe IUGR  S: No complaints Good FM No  Contractions, bleeding or LOF  O: Blood pressure (!) 92/51, pulse (!) 57, temperature 97.9 F (36.6 C), temperature source Oral, resp. rate 18, height  (1.676 m), weight 71.7 kg (158 lb), last menstrual period 12/11/2016, SpO2 100 %, unknown if currently breastfeeding. NCAT HEENT: nl Neck : supple with FROM Lungs: CTA CV: RRR ABD: Gravid , NT, S<D No CVAT EXT: SCDs, no cords Neuro : nonfocal Skin: intact  FHR 140s - 150s , pos accels, no decels No contractions noted Occ variable Category 1 tracing BPP 8/8 on 9/30 BPP 8/8 today with no reversal and intermittent AEDF  A:  32 4/7 wk IUP Severe IUGR Abnl UAD with AEDF and intermittent reversal  P: DC Continuous EFM to 1hour tid (discussed with MFM) Daily BPP with UAD - add growth sono tomorrow Will discuss plan of care with MFM today- Dr. Sherrie George aware Will deliver by 34 weeks.

## 2017-07-28 ENCOUNTER — Ambulatory Visit (HOSPITAL_COMMUNITY)
Admission: RE | Admit: 2017-07-28 | Discharge: 2017-07-28 | Disposition: A | Discharge status: Home / Self Care | Source: Ambulatory Visit

## 2017-07-28 DIAGNOSIS — Z3A32 32 weeks gestation of pregnancy: Secondary | ICD-10-CM | POA: Insufficient documentation

## 2017-07-28 DIAGNOSIS — O365930 Maternal care for other known or suspected poor fetal growth, third trimester, not applicable or unspecified: Secondary | ICD-10-CM | POA: Insufficient documentation

## 2017-07-28 DIAGNOSIS — Z362 Encounter for other antenatal screening follow-up: Secondary | ICD-10-CM | POA: Diagnosis not present

## 2017-07-28 NOTE — Progress Notes (Signed)
Patient ID: Cynthia Briggs, female   DOB: 1985-11-23, 31 y.o.   MRN: [id number] BPP 8/8 EFW 1143 gms < 5th Nl AFI UAD with intermittent AEDF, no reversal Continue TID monitoring and daily BPP

## 2017-07-28 NOTE — Plan of Care (Signed)
Problem: Coping: Goal: Ability to identify and develop effective coping behavior will improve Outcome: Progressing Patient given time to review what she knew from today's MFM studies. Reassured that baby is growing "better than expected". Able to verbalize concerns and talk about hope and expectations for the future. Coping well with family and friend's support and prayers.

## 2017-07-28 NOTE — Progress Notes (Signed)
Patient ID: Cynthia Briggs, female   DOB: May 05, 1986, 31 y.o.   MRN: [id number] HD #5 Severe IUGR  S: No complaints Good FM No  Contractions, bleeding or LOF  O: Blood pressure 115/66, pulse 63, temperature 99.7 F (37.6 C), temperature source Oral, resp. rate 18, height  (1.676 m), weight 71.7 kg (158 lb), last menstrual period 12/11/2016, SpO2 100 %, unknown if currently breastfeeding. NCAT HEENT: nl Neck : supple with FROM Lungs: CTA CV: RRR ABD: Gravid , NT, S<D No CVAT EXT: SCDs, no cords Neuro : nonfocal Skin: intact  FHR 130s - 140s , pos accels, no decels No contractions noted  Category 1 tracing x 3  BPP 8/8 today with no reversal and intermittent AEDF  A:  32 5/7 wk IUP Severe IUGR Abnl UAD with AEDF and intermittent reversal  P: EFM  1hour tid (discussed with MFM) Daily BPP with UAD - add growth sono today Will discuss plan of care with MFM today- Dr. Sherrie George aware Will deliver by 34 weeks.

## 2017-07-28 NOTE — Plan of Care (Signed)
Problem: Coping: Goal: Level of anxiety will decrease Outcome: Progressing Patient is aware of condition of her baby and the plan of care to prevent complications. She keeps in contact with her support/family/friends and is open to talking with staff with questions.

## 2017-07-29 ENCOUNTER — Inpatient Hospital Stay (HOSPITAL_COMMUNITY)

## 2017-07-29 LAB — TYPE AND SCREEN
ABO/RH(D): A POS
Antibody Screen: NEGATIVE

## 2017-07-29 NOTE — Progress Notes (Signed)
Patient ID: Cynthia Briggs, female   DOB: [birth year], 31 y.o.   MRN: [id number] HD #6 Severe IUGR  S: No complaints Good FM No  Contractions, bleeding or LOF  O: Blood pressure 121/76, pulse 78, temperature 98.1 F (36.7 C), temperature source Oral, resp. rate 17, height  (1.676 m), weight 71.7 kg (158 lb), last menstrual period 12/11/2016, SpO2 100 %, unknown if currently breastfeeding. NCAT HEENT: nl Neck : supple with FROM Lungs: CTA CV: RRR ABD: Gravid , NT, S<D No CVAT EXT: SCDs, no cords Neuro : nonfocal Skin: intact  FHR 130s - 140s , pos accels, no decels No contractions noted  Category 1 tracing x 3  BPP 8/8 today with no reversal and intermittent AEDF  A:  32 6/7 wk IUP Severe IUGR Abnl UAD with intermittent AEDF and no reversal  P: EFM  1hour tid (discussed with MFM) Daily BPP with UAD  Will deliver by 34 weeks.

## 2017-07-30 ENCOUNTER — Inpatient Hospital Stay (HOSPITAL_COMMUNITY)

## 2017-07-30 NOTE — Progress Notes (Addendum)
Patient ID: Cynthia Briggs, female   DOB: 1986-04-08, 31 y.o.   MRN: [id number] HD #7 Severe IUGR  S: No complaints Good FM No  Contractions, bleeding or LOF  O: Blood pressure 122/75, pulse 64, temperature 97.9 F (36.6 C), temperature source Oral, resp. rate 18, height  (1.676 m), weight 71.7 kg (158 lb), last menstrual period 12/11/2016, SpO2 96 %, unknown if currently breastfeeding. NCAT HEENT: nl Neck : supple with FROM Lungs: CTA CV: RRR ABD: Gravid , NT, S<D No CVAT EXT: SCDs, no cords Neuro : nonfocal Skin: intact  FHR 130s - 140s , pos accels, rare late decels Rare contractions noted  Category 1 tracing x 3  BPP 8/8 today with no reversal and intermittent AEDF  A:  33 0/7 wk IUP Severe IUGR Abnl UAD with intermittent AEDF and no reversal  P: EFM  1hour tid (discussed with MFM) Daily BPP with UAD  Will deliver by 34 weeks per discussion with MFM

## 2017-07-31 ENCOUNTER — Inpatient Hospital Stay (HOSPITAL_COMMUNITY)

## 2017-07-31 ENCOUNTER — Ambulatory Visit (HOSPITAL_COMMUNITY)

## 2017-07-31 NOTE — Progress Notes (Signed)
Initial Nutrition Assessment  DOCUMENTATION CODES:  Not applicable  INTERVENTION:  Regular diet May order double protein portions, snacks TID and from retail  NUTRITION DIAGNOSIS:  Increased nutrient needs related to  (pregnancy and fetal growth requirements) as evidenced by  (33 weeks IUP). GOAL:  Patient will meet greater than or equal to 90% of their needs MONITOR:  Weight trends  REASON FOR ASSESSMENT:  Antenatal   ASSESSMENT:  33 1/7 weeks, IUGR. pre-preg weight 151 lbs, 7 lb weight gain  Diet Order:  Diet regular Room service appropriate? Yes; Fluid consistency: Thin Skin:  Reviewed, no issues  Height:   Ht Readings from Last 1 Encounters:  07/24/17  (1.676 m)    Weight:   Wt Readings from Last 1 Encounters:  07/24/17 158 lb (71.7 kg)    Ideal Body Weight:     BMI:  Body mass index is 25.5 kg/m.  Estimated Nutritional Needs:   Kcal:  2000-2200  Protein:  90-100 g  Fluid:  2.3 L  EDUCATION NEEDS:   No education needs identified at this time  Inez Pilgrim.Odis Luster LDN Neonatal Nutrition Support Specialist/RD III Pager [phone]      Phone [phone]

## 2017-07-31 NOTE — Progress Notes (Addendum)
Patient ID: Cynthia Briggs, female   DOB: Apr 17, 1986, 31 y.o.   MRN: [id number] HD #8 Severe IUGR  S: No complaints Good FM No  Contractions, bleeding or LOF  O: Blood pressure 112/67, pulse 61, temperature 98.1 F (36.7 C), temperature source Oral, resp. rate 18, height  (1.676 m), weight 71.7 kg (158 lb), last menstrual period 12/11/2016, SpO2 100 %, unknown if currently breastfeeding. NCAT HEENT: nl Neck : supple with FROM Lungs: CTA CV: RRR ABD: Gravid , NT, S<D No CVAT EXT: SCDs, no cords Neuro : nonfocal Skin: intact  FHR 130s - 140s , pos accels, rare late decels, occ variable decels Rare contractions noted  Category 1 tracing x 3  BPP 8/8 10/4 with intermittent reversal and intermittent AEDF  A:  33 1/7 wk IUP Severe IUGR Abnl UAD with intermittent AEDF and no reversal. FHR tracing with inc frequency of variable and late decelerations. 15x15 accels noted.  P: EFM  1hour tid (discussed with MFM) Daily BPP with UAD  Will deliver by 34 weeks per discussion with MFM

## 2017-08-01 ENCOUNTER — Inpatient Hospital Stay (HOSPITAL_COMMUNITY)

## 2017-08-01 LAB — TYPE AND SCREEN
ABO/RH(D): A POS
Antibody Screen: NEGATIVE

## 2017-08-01 NOTE — Progress Notes (Signed)
Patient ID: Cynthia Briggs, female   DOB: 21-Feb-1986, 31 y.o.   MRN: [id number] HD #9 Severe IUGR  S: No complaints Good FM No  Contractions, bleeding or LOF  O: Blood pressure 128/82, pulse 63, temperature 98.1 F (36.7 C), temperature source Oral, resp. rate 18, height  (1.676 m), weight 71.7 kg (158 lb), last menstrual period 12/11/2016, SpO2 99 %, unknown if currently breastfeeding. NCAT HEENT: nl Neck : supple with FROM Lungs: CTA CV: RRR ABD: Gravid , NT, S<D No CVAT EXT: SCDs, no cords Neuro : nonfocal Skin: intact  FHR 130s - 140s , pos accels, rare late decels, occ variable decels Rare contractions noted  Category 1 tracing x 3  BPP 8/8 10/6 with no reversal and intermittent AEDF  A:  33 2/7 wk IUP Severe IUGR Abnl UAD with intermittent AEDF and no reversal. FHR tracing with inc frequency of variable and late decelerations. 15x15 accels noted.  P: EFM  1hour tid (discussed with MFM) Daily BPP with UAD  Will deliver by 34 weeks per discussion with MFM

## 2017-08-02 ENCOUNTER — Inpatient Hospital Stay (HOSPITAL_COMMUNITY)

## 2017-08-02 NOTE — Progress Notes (Signed)
Patient ID: Cynthia Briggs, female   DOB: 10-13-1986, 31 y.o.   MRN: [id number] HD #10 Severe IUGR  S: No complaints Good FM No  Contractions, bleeding or LOF Would prefer outpt management  O: Blood pressure 111/66, pulse 66, temperature 98.2 F (36.8 C), temperature source Oral, resp. rate 16, height  (1.676 m), weight 71.7 kg (158 lb), last menstrual period 12/11/2016, SpO2 99 %, unknown if currently breastfeeding. NCAT HEENT: nl Neck : supple with FROM Lungs: CTA CV: RRR ABD: Gravid , NT, S<D No CVAT EXT: SCDs, no cords Neuro : nonfocal Skin: intact  FHR 130s - 140s , pos accels, rare late decels, occ variable decels Rare contractions noted  Category 1 tracing x 3  BPP 8/8 10/7 with intermittent reversal and intermittent AEDF  A:  33 3/7 wk IUP Severe IUGR Abnl UAD with intermittent AEDF and intermittent reversal. FHR tracing with inc frequency of variable and late decelerations. 15x15 accels noted.  P: EFM  1hour tid (discussed with MFM) Daily BPP with UAD  Will deliver by 34 weeks per discussion with MFM Will discuss outpt management with MFM tomorrow pending BPP and UAD

## 2017-08-03 ENCOUNTER — Other Ambulatory Visit (HOSPITAL_COMMUNITY)

## 2017-08-03 ENCOUNTER — Inpatient Hospital Stay (HOSPITAL_COMMUNITY)

## 2017-08-03 NOTE — Progress Notes (Signed)
Patient ID: Cynthia Briggs, female   DOB: 10/06/1986, 31 y.o.   MRN: [id number] HD #11 Severe IUGR  S: No complaints Good FM No  Contractions, bleeding or LOF Would prefer outpt management  O: Blood pressure 110/77, pulse 63, temperature 97.9 F (36.6 C), temperature source Oral, resp. rate 17, height  (1.676 m), weight 71.7 kg (158 lb), last menstrual period 12/11/2016, SpO2 99 %, unknown if currently breastfeeding. NCAT HEENT: nl Neck : supple with FROM Lungs: CTA CV: RRR ABD: Gravid , NT, S<D No CVAT EXT: SCDs, no cords Neuro : nonfocal Skin: intact  FHR 130s - 140s , pos accels, rare late decels, occ variable decels Rare contractions noted  Category 1 tracing x 3  BPP 8/8 10/7 with intermittent reversal and intermittent AEDF  A:  33 4/7 wk IUP Severe IUGR Abnl UAD with intermittent AEDF and intermittent reversal. FHR tracing with inc frequency of variable and late decelerations. 15x15 accels noted.  P: EFM  1hour tid (discussed with MFM) Daily BPP with UAD  Will deliver by 34 weeks per discussion with MFM Will discuss outpt management with MFM tomorrow pending BPP and UAD

## 2017-08-04 ENCOUNTER — Inpatient Hospital Stay (HOSPITAL_COMMUNITY)

## 2017-08-04 ENCOUNTER — Other Ambulatory Visit: Payer: Self-pay | Admitting: Obstetrics and Gynecology

## 2017-08-04 ENCOUNTER — Ambulatory Visit (HOSPITAL_COMMUNITY)

## 2017-08-04 MED ORDER — BETAMETHASONE SOD PHOS & ACET 6 (3-3) MG/ML IJ SUSP
12.0000 mg | Freq: Once | INTRAMUSCULAR | Status: AC
  Administered 2017-08-04: 12 mg via INTRAMUSCULAR
  Filled 2017-08-04: qty 2

## 2017-08-04 NOTE — Progress Notes (Signed)
Patient ID: Cynthia Briggs, female   DOB: [birth year], 31 y.o.   MRN: [id number] HD #12 Severe IUGR  S: No complaints Good FM No  Contractions, bleeding or LOF Would prefer outpt management  O: Blood pressure 114/65, pulse 73, temperature 98 F (36.7 C), temperature source Oral, resp. rate 16, height  (1.676 m), weight 71.7 kg (158 lb), last menstrual period 12/11/2016, SpO2 98 %, unknown if currently breastfeeding. NCAT HEENT: nl Neck : supple with FROM Lungs: CTA CV: RRR ABD: Gravid , NT, S<D No CVAT EXT: SCDs, no cords Neuro : nonfocal Skin: intact  FHR 130s - 140s , pos accels, rare late decels, occ variable decels Rare contractions noted  Category 1 tracing x 3  BPP 8/8 10/7 with intermittent reversal and intermittent AEDF  A:  33 5/7 wk IUP Severe IUGR Abnl UAD with intermittent AEDF and intermittent reversal. FHR tracing with inc frequency of variable and late decelerations. 15x15 accels noted.  P: EFM  1hour tid (discussed with MFM) Daily BPP with UAD  Will deliver by 34 weeks per discussion with MFM Will discuss outpt management with MFM tomorrow pending BPP and UAD

## 2017-08-04 NOTE — Consult Note (Signed)
Neonatology Consult to Antenatal Patient:  I was asked by Dr. Billy Coast to see this patient a second time in order to provide antenatal counseling due to planned c-section delivery at 33 6/7 weeks due to absent EDF and intermittent REDF, with severe IUGR. EFW on 10/2 was 1143 grams. The baby is female and is the mother's third child.   I spoke with the patient alone. We discussed DR management, possible (though unlikely) respiratory complications and need for support, IV access (will require PIV access immediately on admission to NICU due to risk for hypoglycemia, and is likely to require central line placement), feedings (mother desires breast feeding, which was encouraged), need to advance feeding volumes slowly, and LOS. She had several questions, which I answered.  Thank you for asking me to see this patient.  Doretha Sou, MD Neonatologist  The total length of face-to-face or floor/unit time for this encounter was 25 minutes. Counseling and/or coordination of care was 20 minutes of the above.

## 2017-08-04 NOTE — Plan of Care (Signed)
Problem: Coping: Goal: Level of anxiety will decrease Patient states, "I'm doing ok. I know they want to take him Thursday. I just hope he's still doing ok." Emotional ans spiritual support from family and friends.

## 2017-08-05 ENCOUNTER — Inpatient Hospital Stay (HOSPITAL_COMMUNITY): Admitting: Certified Registered Nurse Anesthetist

## 2017-08-05 ENCOUNTER — Encounter (HOSPITAL_COMMUNITY)
Admission: AD | Disposition: A | Discharge status: Home / Self Care | Payer: Self-pay | Source: Ambulatory Visit | Attending: Obstetrics and Gynecology

## 2017-08-05 ENCOUNTER — Encounter (HOSPITAL_COMMUNITY): Payer: Self-pay | Admitting: Obstetrics and Gynecology

## 2017-08-05 LAB — TYPE AND SCREEN
ABO/RH(D): A POS
ANTIBODY SCREEN: NEGATIVE

## 2017-08-05 LAB — CBC
HEMATOCRIT: 36.9 % (ref 36.0–46.0)
HEMOGLOBIN: 13.4 g/dL (ref 12.0–15.0)
MCH: 31.7 pg (ref 26.0–34.0)
MCHC: 36.3 g/dL — ABNORMAL HIGH (ref 30.0–36.0)
MCV: 87.2 fL (ref 78.0–100.0)
PLATELETS: 243 10*3/uL (ref 150–400)
RBC: 4.23 MIL/uL (ref 3.87–5.11)
RDW: 12.8 % (ref 11.5–15.5)
WBC: 13.5 10*3/uL — AB (ref 4.0–10.5)

## 2017-08-05 LAB — RPR: RPR Ser Ql: NONREACTIVE

## 2017-08-05 SURGERY — Surgical Case
Anesthesia: Spinal

## 2017-08-05 MED ORDER — IBUPROFEN 600 MG PO TABS
600.0000 mg | ORAL_TABLET | Freq: Four times a day (QID) | ORAL | Status: DC
  Administered 2017-08-06 – 2017-08-07 (×8): 600 mg via ORAL
  Filled 2017-08-05 (×8): qty 1

## 2017-08-05 MED ORDER — ONDANSETRON HCL 4 MG/2ML IJ SOLN: 4.0000 mg | Freq: Three times a day (TID) | INTRAMUSCULAR | Status: DC | PRN

## 2017-08-05 MED ORDER — KETOROLAC TROMETHAMINE 30 MG/ML IJ SOLN: 30.0000 mg | Freq: Four times a day (QID) | INTRAMUSCULAR | Status: DC | PRN

## 2017-08-05 MED ORDER — OXYTOCIN 40 UNITS IN LACTATED RINGERS INFUSION - SIMPLE MED: 2.5000 [IU]/h | INTRAVENOUS | Status: AC

## 2017-08-05 MED ORDER — FENTANYL CITRATE (PF) 100 MCG/2ML IJ SOLN: 25.0000 ug | INTRAMUSCULAR | Status: DC | PRN

## 2017-08-05 MED ORDER — SIMETHICONE 80 MG PO CHEW: 80.0000 mg | CHEWABLE_TABLET | ORAL | Status: DC | PRN

## 2017-08-05 MED ORDER — SOD CITRATE-CITRIC ACID 500-334 MG/5ML PO SOLN: 30.0000 mL | Freq: Once | ORAL | Status: DC

## 2017-08-05 MED ORDER — PHENYLEPHRINE 40 MCG/ML (10ML) SYRINGE FOR IV PUSH (FOR BLOOD PRESSURE SUPPORT)
PREFILLED_SYRINGE | INTRAVENOUS | Status: AC
  Filled 2017-08-05: qty 10

## 2017-08-05 MED ORDER — PHENYLEPHRINE 8 MG IN D5W 100 ML (0.08MG/ML) PREMIX OPTIME
INJECTION | INTRAVENOUS | Status: DC | PRN
  Administered 2017-08-05: 60 ug/min via INTRAVENOUS

## 2017-08-05 MED ORDER — DIPHENHYDRAMINE HCL 50 MG/ML IJ SOLN: 12.5000 mg | INTRAMUSCULAR | Status: DC | PRN

## 2017-08-05 MED ORDER — OXYCODONE-ACETAMINOPHEN 5-325 MG PO TABS: 2.0000 | ORAL_TABLET | ORAL | Status: DC | PRN

## 2017-08-05 MED ORDER — NALOXONE HCL 0.4 MG/ML IJ SOLN: 0.4000 mg | INTRAMUSCULAR | Status: DC | PRN

## 2017-08-05 MED ORDER — OXYCODONE-ACETAMINOPHEN 5-325 MG PO TABS: 1.0000 | ORAL_TABLET | ORAL | Status: DC | PRN

## 2017-08-05 MED ORDER — NALBUPHINE HCL 10 MG/ML IJ SOLN: 5.0000 mg | Freq: Once | INTRAMUSCULAR | Status: DC | PRN

## 2017-08-05 MED ORDER — KETOROLAC TROMETHAMINE 30 MG/ML IJ SOLN
30.0000 mg | Freq: Four times a day (QID) | INTRAMUSCULAR | Status: AC | PRN
Start: 2017-08-05 — End: 2017-08-06
  Administered 2017-08-05: 30 mg via INTRAMUSCULAR

## 2017-08-05 MED ORDER — DIPHENHYDRAMINE HCL 25 MG PO CAPS: 25.0000 mg | ORAL_CAPSULE | ORAL | Status: DC | PRN

## 2017-08-05 MED ORDER — NALOXONE HCL 2 MG/2ML IJ SOSY
1.0000 ug/kg/h | PREFILLED_SYRINGE | INTRAMUSCULAR | Status: DC | PRN
Start: 2017-08-05 — End: 2017-08-07

## 2017-08-05 MED ORDER — SIMETHICONE 80 MG PO CHEW
80.0000 mg | CHEWABLE_TABLET | ORAL | Status: DC
  Administered 2017-08-06: 80 mg via ORAL
  Filled 2017-08-05 (×2): qty 1

## 2017-08-05 MED ORDER — BUPIVACAINE IN DEXTROSE 0.75-8.25 % IT SOLN
INTRATHECAL | Status: AC
  Filled 2017-08-05: qty 2

## 2017-08-05 MED ORDER — LACTATED RINGERS IV SOLN: INTRAVENOUS | Status: DC

## 2017-08-05 MED ORDER — ONDANSETRON HCL 4 MG/2ML IJ SOLN
INTRAMUSCULAR | Status: AC
Start: 2017-08-05 — End: 2017-08-05
  Filled 2017-08-05: qty 2

## 2017-08-05 MED ORDER — NALBUPHINE HCL 10 MG/ML IJ SOLN: 5.0000 mg | INTRAMUSCULAR | Status: DC | PRN

## 2017-08-05 MED ORDER — LACTATED RINGERS IV SOLN: 100.0000 mL/h | INTRAVENOUS | Status: DC

## 2017-08-05 MED ORDER — CEFAZOLIN SODIUM-DEXTROSE 2-4 GM/100ML-% IV SOLN
2.0000 g | INTRAVENOUS | Status: AC
  Administered 2017-08-05: 2 g via INTRAVENOUS
  Filled 2017-08-05: qty 100

## 2017-08-05 MED ORDER — MEPERIDINE HCL 25 MG/ML IJ SOLN: 6.2500 mg | INTRAMUSCULAR | Status: DC | PRN

## 2017-08-05 MED ORDER — SODIUM CHLORIDE 0.9% FLUSH
3.0000 mL | INTRAVENOUS | Status: DC | PRN
Start: 2017-08-05 — End: 2017-08-05

## 2017-08-05 MED ORDER — SCOPOLAMINE 1 MG/3DAYS TD PT72
MEDICATED_PATCH | TRANSDERMAL | Status: DC | PRN
  Administered 2017-08-05: 1 via TRANSDERMAL

## 2017-08-05 MED ORDER — MORPHINE SULFATE (PF) 0.5 MG/ML IJ SOLN
INTRAMUSCULAR | Status: DC | PRN
  Administered 2017-08-05: .1 mg via INTRATHECAL

## 2017-08-05 MED ORDER — METHYLERGONOVINE MALEATE 0.2 MG PO TABS: 0.2000 mg | ORAL_TABLET | ORAL | Status: DC | PRN

## 2017-08-05 MED ORDER — MORPHINE SULFATE (PF) 0.5 MG/ML IJ SOLN
INTRAMUSCULAR | Status: AC
  Filled 2017-08-05: qty 10

## 2017-08-05 MED ORDER — DIPHENHYDRAMINE HCL 25 MG PO CAPS
25.0000 mg | ORAL_CAPSULE | Freq: Four times a day (QID) | ORAL | Status: DC | PRN
Start: 2017-08-05 — End: 2017-08-07

## 2017-08-05 MED ORDER — SENNOSIDES-DOCUSATE SODIUM 8.6-50 MG PO TABS
2.0000 | ORAL_TABLET | ORAL | Status: DC
  Administered 2017-08-06: 2 via ORAL
  Filled 2017-08-05 (×2): qty 2

## 2017-08-05 MED ORDER — LACTATED RINGERS IV SOLN
INTRAVENOUS | Status: DC | PRN
  Administered 2017-08-05 (×2): via INTRAVENOUS

## 2017-08-05 MED ORDER — NALOXONE HCL 2 MG/2ML IJ SOSY: 1.0000 ug/kg/h | PREFILLED_SYRINGE | INTRAVENOUS | Status: DC | PRN

## 2017-08-05 MED ORDER — BUPIVACAINE IN DEXTROSE 0.75-8.25 % IT SOLN
INTRATHECAL | Status: DC | PRN
  Administered 2017-08-05: 1.6 mL via INTRATHECAL

## 2017-08-05 MED ORDER — METHYLERGONOVINE MALEATE 0.2 MG/ML IJ SOLN: 0.2000 mg | INTRAMUSCULAR | Status: DC | PRN

## 2017-08-05 MED ORDER — MENTHOL 3 MG MT LOZG: 1.0000 | LOZENGE | OROMUCOSAL | Status: DC | PRN

## 2017-08-05 MED ORDER — SODIUM CHLORIDE 0.9% FLUSH
3.0000 mL | INTRAVENOUS | Status: DC | PRN
  Administered 2017-08-06 (×2): 3 mL via INTRAVENOUS
  Filled 2017-08-05 (×2): qty 3

## 2017-08-05 MED ORDER — DIBUCAINE 1 % RE OINT: 1.0000 "application " | TOPICAL_OINTMENT | RECTAL | Status: DC | PRN

## 2017-08-05 MED ORDER — COCONUT OIL OIL
1.0000 "application " | TOPICAL_OIL | Status: DC | PRN
  Filled 2017-08-05: qty 120

## 2017-08-05 MED ORDER — SCOPOLAMINE 1 MG/3DAYS TD PT72: 1.0000 | MEDICATED_PATCH | Freq: Once | TRANSDERMAL | Status: DC

## 2017-08-05 MED ORDER — ONDANSETRON HCL 4 MG/2ML IJ SOLN: 4.0000 mg | Freq: Once | INTRAMUSCULAR | Status: DC | PRN

## 2017-08-05 MED ORDER — IBUPROFEN 600 MG PO TABS
600.0000 mg | ORAL_TABLET | Freq: Four times a day (QID) | ORAL | Status: DC | PRN
  Administered 2017-08-05: 600 mg via ORAL
  Filled 2017-08-05: qty 1

## 2017-08-05 MED ORDER — WITCH HAZEL-GLYCERIN EX PADS: 1.0000 "application " | MEDICATED_PAD | CUTANEOUS | Status: DC | PRN

## 2017-08-05 MED ORDER — BUPIVACAINE HCL (PF) 0.25 % IJ SOLN
INTRAMUSCULAR | Status: DC | PRN
  Administered 2017-08-05: 30 mL

## 2017-08-05 MED ORDER — CEFAZOLIN SODIUM-DEXTROSE 2-4 GM/100ML-% IV SOLN
INTRAVENOUS | Status: AC
  Filled 2017-08-05: qty 100

## 2017-08-05 MED ORDER — KETOROLAC TROMETHAMINE 30 MG/ML IJ SOLN: 30.0000 mg | Freq: Four times a day (QID) | INTRAMUSCULAR | Status: AC | PRN

## 2017-08-05 MED ORDER — BUPIVACAINE HCL (PF) 0.25 % IJ SOLN
INTRAMUSCULAR | Status: AC
  Filled 2017-08-05: qty 30

## 2017-08-05 MED ORDER — TETANUS-DIPHTH-ACELL PERTUSSIS 5-2.5-18.5 LF-MCG/0.5 IM SUSP: 0.5000 mL | Freq: Once | INTRAMUSCULAR | Status: DC

## 2017-08-05 MED ORDER — METOCLOPRAMIDE HCL 5 MG/ML IJ SOLN
INTRAMUSCULAR | Status: DC | PRN
  Administered 2017-08-05: 10 mg via INTRAVENOUS

## 2017-08-05 MED ORDER — KETOROLAC TROMETHAMINE 30 MG/ML IJ SOLN
INTRAMUSCULAR | Status: AC
  Filled 2017-08-05: qty 1

## 2017-08-05 MED ORDER — FENTANYL CITRATE (PF) 100 MCG/2ML IJ SOLN
INTRAMUSCULAR | Status: DC | PRN
  Administered 2017-08-05: 20 ug via INTRAVENOUS

## 2017-08-05 MED ORDER — ZOLPIDEM TARTRATE 5 MG PO TABS: 5.0000 mg | ORAL_TABLET | Freq: Every evening | ORAL | Status: DC | PRN

## 2017-08-05 MED ORDER — DEXAMETHASONE SODIUM PHOSPHATE 4 MG/ML IJ SOLN
INTRAMUSCULAR | Status: DC | PRN
  Administered 2017-08-05: 8 mg via INTRAVENOUS

## 2017-08-05 MED ORDER — OXYTOCIN 10 UNIT/ML IJ SOLN
INTRAVENOUS | Status: DC | PRN
  Administered 2017-08-05: 40 [IU] via INTRAVENOUS

## 2017-08-05 MED ORDER — ACETAMINOPHEN 325 MG PO TABS: 650.0000 mg | ORAL_TABLET | ORAL | Status: DC | PRN

## 2017-08-05 MED ORDER — FENTANYL CITRATE (PF) 100 MCG/2ML IJ SOLN
INTRAMUSCULAR | Status: AC
  Filled 2017-08-05: qty 2

## 2017-08-05 MED ORDER — PHENYLEPHRINE 8 MG IN D5W 100 ML (0.08MG/ML) PREMIX OPTIME
INJECTION | INTRAVENOUS | Status: AC
  Filled 2017-08-05: qty 100

## 2017-08-05 MED ORDER — SIMETHICONE 80 MG PO CHEW
80.0000 mg | CHEWABLE_TABLET | Freq: Three times a day (TID) | ORAL | Status: DC
  Administered 2017-08-05 – 2017-08-07 (×5): 80 mg via ORAL
  Filled 2017-08-05 (×6): qty 1

## 2017-08-05 MED ORDER — ONDANSETRON HCL 4 MG/2ML IJ SOLN
INTRAMUSCULAR | Status: DC | PRN
  Administered 2017-08-05: 4 mg via INTRAVENOUS

## 2017-08-05 MED ORDER — OXYTOCIN 10 UNIT/ML IJ SOLN
INTRAMUSCULAR | Status: AC
  Filled 2017-08-05: qty 4

## 2017-08-05 MED ORDER — PRENATAL MULTIVITAMIN CH
1.0000 | ORAL_TABLET | Freq: Every day | ORAL | Status: DC
  Filled 2017-08-05 (×2): qty 1

## 2017-08-05 MED ORDER — LACTATED RINGERS IV SOLN
INTRAVENOUS | Status: DC | PRN
  Administered 2017-08-05: 15:00:00 via INTRAVENOUS

## 2017-08-05 MED ORDER — SOD CITRATE-CITRIC ACID 500-334 MG/5ML PO SOLN
ORAL | Status: AC
  Filled 2017-08-05: qty 15

## 2017-08-05 SURGICAL SUPPLY — 35 items
CHLORAPREP W/TINT 26ML (MISCELLANEOUS) ×3 IMPLANT
CLAMP CORD UMBIL (MISCELLANEOUS) IMPLANT
CLOTH BEACON ORANGE TIMEOUT ST (SAFETY) ×3 IMPLANT
CONTAINER PREFILL 10% NBF 15ML (MISCELLANEOUS) IMPLANT
DERMABOND ADVANCED (GAUZE/BANDAGES/DRESSINGS) ×4
DERMABOND ADVANCED .7 DNX12 (GAUZE/BANDAGES/DRESSINGS) ×2 IMPLANT
DRSG OPSITE POSTOP 4X10 (GAUZE/BANDAGES/DRESSINGS) ×3 IMPLANT
ELECT REM PT RETURN 9FT ADLT (ELECTROSURGICAL) ×3
ELECTRODE REM PT RTRN 9FT ADLT (ELECTROSURGICAL) ×1 IMPLANT
EXTRACTOR VACUUM M CUP 4 TUBE (SUCTIONS) IMPLANT
EXTRACTOR VACUUM M CUP 4' TUBE (SUCTIONS)
GLOVE BIO SURGEON STRL SZ7.5 (GLOVE) ×3 IMPLANT
GLOVE BIOGEL PI IND STRL 7.0 (GLOVE) ×1 IMPLANT
GLOVE BIOGEL PI INDICATOR 7.0 (GLOVE) ×2
GOWN STRL REUS W/TWL LRG LVL3 (GOWN DISPOSABLE) ×6 IMPLANT
KIT ABG SYR 3ML LUER SLIP (SYRINGE) IMPLANT
NEEDLE HYPO 22GX1.5 SAFETY (NEEDLE) ×3 IMPLANT
NEEDLE HYPO 25X5/8 SAFETYGLIDE (NEEDLE) IMPLANT
NEEDLE SPNL 20GX3.5 QUINCKE YW (NEEDLE) IMPLANT
NS IRRIG 1000ML POUR BTL (IV SOLUTION) ×3 IMPLANT
PACK C SECTION WH (CUSTOM PROCEDURE TRAY) ×3 IMPLANT
PENCIL SMOKE EVAC W/HOLSTER (ELECTROSURGICAL) ×3 IMPLANT
SUT MNCRL 0 VIOLET CTX 36 (SUTURE) ×2 IMPLANT
SUT MNCRL AB 3-0 PS2 27 (SUTURE) IMPLANT
SUT MON AB 2-0 CT1 27 (SUTURE) ×3 IMPLANT
SUT MON AB-0 CT1 36 (SUTURE) ×6 IMPLANT
SUT MONOCRYL 0 CTX 36 (SUTURE) ×4
SUT PLAIN 0 NONE (SUTURE) IMPLANT
SUT PLAIN 2 0 (SUTURE) ×2
SUT PLAIN 2 0 XLH (SUTURE) IMPLANT
SUT PLAIN ABS 2-0 CT1 27XMFL (SUTURE) ×1 IMPLANT
SYR 20CC LL (SYRINGE) IMPLANT
SYR CONTROL 10ML LL (SYRINGE) ×3 IMPLANT
TOWEL OR 17X24 6PK STRL BLUE (TOWEL DISPOSABLE) ×3 IMPLANT
TRAY FOLEY BAG SILVER LF 14FR (SET/KITS/TRAYS/PACK) ×3 IMPLANT

## 2017-08-05 NOTE — Anesthesia Postprocedure Evaluation (Signed)
Anesthesia Post Note  Patient: Cynthia Briggs  Procedure(s) Performed: Primary CESAREAN SECTION (N/A )     Patient location during evaluation: PACU Anesthesia Type: Spinal Level of consciousness: oriented and awake and alert Pain management: pain level controlled Vital Signs Assessment: post-procedure vital signs reviewed and stable Respiratory status: spontaneous breathing, respiratory function stable and patient connected to nasal cannula oxygen Cardiovascular status: blood pressure returned to baseline and stable Postop Assessment: no headache, no backache and no apparent nausea or vomiting Anesthetic complications: no    Last Vitals:  Vitals:   08/05/17 1215 08/05/17 1543  BP: 105/60 111/69  Pulse: 76 62  Resp: 18 (!) 21  Temp: 36.9 C 36.8 C  SpO2: 100% 98%    Last Pain:  Vitals:   08/05/17 1543  TempSrc: Oral  PainSc: 0-No pain   Pain Goal:                 Preethi Scantlebury

## 2017-08-05 NOTE — Progress Notes (Signed)
Patient ID: Cynthia Briggs, female   DOB: September 04, 1986, 31 y.o.   MRN: [id number] Tracing reviewed Non repetitive variable and occ late decels in otherwise reactive tracing No changes noted. Good FM. BPP 8/8 on 10/9 Csection today

## 2017-08-05 NOTE — Plan of Care (Signed)
Problem: Education: Goal: Knowledge of condition will improve Outcome: Progressing Patient aware of plan of care for Cesarean Section. Question and answer session to review knowledge.

## 2017-08-05 NOTE — Lactation Note (Signed)
This note was copied from a baby's chart. Lactation Consultation Note  Patient Name: Cynthia Briggs [id number] Date: 08/05/2017    Mom is still in the OR/PACU. NICU lactation brochure, colostrum stickers, and 2 colostrum vials placed in patient's room. Lactation # written on board.   Lurline Hare [facility] 08/05/2017, 4:23 PM

## 2017-08-05 NOTE — Anesthesia Procedure Notes (Signed)
Spinal  Patient location during procedure: OR Start time: 08/05/2017 2:30 PM End time: 08/05/2017 2:33 PM Staffing Anesthesiologist: Vangie Henthorn Preanesthetic Checklist Completed: patient identified, site marked, surgical consent, pre-op evaluation, timeout performed, IV checked, risks and benefits discussed and monitors and equipment checked Spinal Block Patient position: sitting Prep: DuraPrep Patient monitoring: heart rate, cardiac monitor, continuous pulse ox and blood pressure Approach: midline Location: L4-5 Injection technique: single-shot Needle Needle type: Sprotte  Needle gauge: 24 G Needle length: 9 cm Assessment Sensory level: T4

## 2017-08-05 NOTE — Plan of Care (Signed)
Problem: Coping: Goal: Level of anxiety will decrease Outcome: Progressing Patient anxious but demonstrating appropriate coping behaviors.   Goal: Ability to identify and develop effective coping behavior will improve Outcome: Progressing Paitent demonstrating appropriate coping behaviors.

## 2017-08-05 NOTE — Anesthesia Preprocedure Evaluation (Signed)
Anesthesia Evaluation  Patient identified by MRN, date of birth, ID band Patient awake    Reviewed: Allergy & Precautions, H&P , NPO status , Patient's Chart, lab work & pertinent test results, reviewed documented beta blocker date and time   Airway Mallampati: II  TM Distance: >3 FB Neck ROM: full    Dental no notable dental hx.    Pulmonary neg pulmonary ROS,    Pulmonary exam normal breath sounds clear to auscultation       Cardiovascular negative cardio ROS Normal cardiovascular exam Rhythm:regular Rate:Normal     Neuro/Psych negative neurological ROS  negative psych ROS   GI/Hepatic negative GI ROS, Neg liver ROS,   Endo/Other  negative endocrine ROS  Renal/GU negative Renal ROS  negative genitourinary   Musculoskeletal   Abdominal   Peds  Hematology negative hematology ROS (+)   Anesthesia Other Findings   Reproductive/Obstetrics (+) Pregnancy                             Anesthesia Physical Anesthesia Plan  ASA: II  Anesthesia Plan: Spinal   Post-op Pain Management:    Induction:   PONV Risk Score and Plan:   Airway Management Planned:   Additional Equipment:   Intra-op Plan:   Post-operative Plan:   Informed Consent: I have reviewed the patients History and Physical, chart, labs and discussed the procedure including the risks, benefits and alternatives for the proposed anesthesia with the patient or authorized representative who has indicated his/her understanding and acceptance.     Plan Discussed with:   Anesthesia Plan Comments:         Anesthesia Quick Evaluation  

## 2017-08-05 NOTE — Transfer of Care (Signed)
Immediate Anesthesia Transfer of Care Note  Patient: Cynthia Briggs  Procedure(s) Performed: Primary CESAREAN SECTION (N/A )  Patient Location: PACU  Anesthesia Type:Spinal  Level of Consciousness: awake, alert  and oriented  Airway & Oxygen Therapy: Patient Spontanous Breathing  Post-op Assessment: Report given to RN and Post -op Vital signs reviewed and stable  Post vital signs: Reviewed and stable  Last Vitals:  Vitals:   08/05/17 0740 08/05/17 1215  BP: 126/84 105/60  Pulse: 92 76  Resp: 18 18  Temp: 36.8 C 36.9 C  SpO2: 99% 100%    Last Pain:  Vitals:   08/05/17 1215  TempSrc: Oral  PainSc:          Complications: No apparent anesthesia complications

## 2017-08-05 NOTE — Op Note (Signed)
Cesarean Section Procedure Note  Indications: patient declines vag del attempt and Severe IUGR with abnormal doppler studies  Pre-operative Diagnosis: 33 week 6 day pregnancy.  Post-operative Diagnosis: same  Surgeon: Lenoard Aden   Assistants: Idelle Jo, CNM  Anesthesia: Local anesthesia 0.25.% bupivacaine and Spinal anesthesia  ASA Class: 2  Procedure Details  The patient was seen in the Holding Room. The risks, benefits, complications, treatment options, and expected outcomes were discussed with the patient.  The patient concurred with the proposed plan, giving informed consent. The risks of anesthesia, infection, bleeding and possible injury to other organs discussed. Injury to bowel, bladder, or ureter with possible need for repair discussed. Possible need for transfusion with secondary risks of hepatitis or HIV acquisition discussed. Post operative complications to include but not limited to DVT, PE and Pneumonia noted. The site of surgery properly noted/marked. The patient was taken to Operating Room # 1, identified as Cynthia Briggs and the procedure verified as C-Section Delivery. A Time Out was held and the above information confirmed.  After induction of anesthesia, the patient was draped and prepped in the usual sterile manner. A Pfannenstiel incision was made and carried down through the subcutaneous tissue to the fascia. Fascial incision was made and extended transversely using Mayo scissors. The fascia was separated from the underlying rectus tissue superiorly and inferiorly. The peritoneum was identified and entered. Peritoneal incision was extended longitudinally. The utero-vesical peritoneal reflection was incised transversely and the bladder flap was bluntly freed from the lower uterine segment. A low transverse uterine incision(Kerr hysterotomy) was made. Delivered from OA presentation was a  female with Apgar scores of pending at one minute and pending at five minutes. Bulb  suctioning gently performed. Neonatal team in attendance.After the umbilical cord was clamped and cut cord blood was obtained for evaluation. The placenta was removed intact and appeared normal. The uterus was curetted with a dry lap pack. Good hemostasis was noted.The uterine outline, tubes and ovaries appeared normal. The uterine incision was closed with running locked sutures of 0 Monocryl x 2 layers. Hemostasis was observed. The parietal peritoneum was closed with a running 2-0 Monocryl suture. The fascia was then reapproximated with running sutures of 0 Monocryl. The skin was reapproximated with 3-0 monocryl after Dawson closure with 2-0 plain.  Instrument, sponge, and needle counts were correct prior the abdominal closure and at the conclusion of the case.   Findings: Preterm viable female, anterior placenta, nl tubes and ovaries  Estimated Blood Loss:  300 mL         Drains: foley                 Specimens: placenta to pathology                    Complications:  None; patient tolerated the procedure well.         Disposition: PACU - hemodynamically stable.         Condition: stable  Attending Attestation: I performed the procedure.

## 2017-08-06 ENCOUNTER — Encounter (HOSPITAL_COMMUNITY): Admit: 2017-08-06

## 2017-08-06 LAB — CBC
HEMATOCRIT: 35.3 % — AB (ref 36.0–46.0)
Hemoglobin: 12.5 g/dL (ref 12.0–15.0)
MCH: 31.4 pg (ref 26.0–34.0)
MCHC: 35.4 g/dL (ref 30.0–36.0)
MCV: 88.7 fL (ref 78.0–100.0)
PLATELETS: 255 10*3/uL (ref 150–400)
RBC: 3.98 MIL/uL (ref 3.87–5.11)
RDW: 12.9 % (ref 11.5–15.5)
WBC: 16.3 10*3/uL — AB (ref 4.0–10.5)

## 2017-08-06 NOTE — Anesthesia Postprocedure Evaluation (Signed)
Anesthesia Post Note  Patient: Cynthia Briggs  Procedure(s) Performed: Primary CESAREAN SECTION (N/A )     Patient location during evaluation: [facility] Anesthesia Type: Spinal Level of consciousness: awake and alert Pain management: pain level controlled Vital Signs Assessment: post-procedure vital signs reviewed and stable Respiratory status: spontaneous breathing, nonlabored ventilation and respiratory function stable Cardiovascular status: stable Postop Assessment: no headache, no backache and epidural receding Anesthetic complications: no    Last Vitals:  Vitals:   08/06/17 0416 08/06/17 0735  BP: (!) 96/56 113/74  Pulse: (!) 52 (!) 55  Resp: 16 18  Temp: 36.6 C 37.1 C  SpO2: 100%     Last Pain:  Vitals:   08/06/17 1212  TempSrc:   PainSc: 1    Pain Goal: Patients Stated Pain Goal: 2 (08/06/17 1212)               Marrion Coy

## 2017-08-06 NOTE — Lactation Note (Signed)
This note was copied from a baby's chart. Lactation Consultation Note  Patient Name: Cynthia Briggs Date: 08/06/2017 Reason for consult: Initial assessment;NICU baby;Preterm <34wks;Infant < 6lbs   Initial assessment with mom of 24 hour old NICU infant. Infant is starting to feed donor milk today. Mom has been able to hold infant today. Mom did BF her 2 daughters. Spoke with mom while she was walking to NICU to take EBM to infant.   Mom is pumping every 2-3 hours for 15 minutes. She is getting a few gtts of colostrum that she is taking to infant in the NICU. Mom reports she is hand expressing. Enc her to hand express post pumping. Reviewed labeling of EBM for NICU infant.  Mom without further questions/concerns at this time. We reviewed hand expression and milk coming to volume. Follow up tomorrow and prn.     Maternal Data Formula Feeding for Exclusion: No Has patient been taught Hand Expression?: Yes Does the patient have breastfeeding experience prior to this delivery?: Yes  Feeding    LATCH Score                   Interventions    Lactation Tools Discussed/Used Pump Review: Setup, frequency, and cleaning;Milk Storage Initiated by:: Reviewed and encouraged every 2-3 hours for 15 minutes   Consult Status Consult Status: Follow-up Date: 08/07/17 Follow-up type: In-patient    Cynthia Briggs 08/06/2017, 3:41 PM

## 2017-08-06 NOTE — Plan of Care (Signed)
Problem: Activity: Goal: Will verbalize the importance of balancing activity with adequate rest periods Outcome: Progressing Patient very excited to see infant son in NICU. Assisted up to wheelchair. Tolerated activity well. Goal: Ability to tolerate increased activity will improve Outcome: Progressing Patient is very fit and able to tolerate activity and pain well. Encouraged to take rest periods and to not "over-do". Husband present to assist with care.   Problem: Coping: Goal: Ability to cope will improve Outcome: Progressing Patient coping well since infant has been delivered and is now doing well in NICU.

## 2017-08-06 NOTE — Progress Notes (Signed)
Patient screened out for psychosocial assessment since none of the following apply:  Psychosocial stressors documented in mother or baby's chart  Gestation less than 32 weeks  Code at delivery   Infant with anomalies Please contact the Clinical Social Worker if specific needs arise, or by MOB's request.   Maui Ahart Boyd-Gilyard, MSW, LCSW Clinical Social Work [phone]  

## 2017-08-06 NOTE — Progress Notes (Signed)
POSTOPERATIVE DAY # 1 S/P Primary LTCS for severe IUGR, baby boy "Christiane Ha" in NICU   S:         Reports feeling well, minimal incisional discomfort              Tolerating po intake / no nausea / no vomiting / + flatus / no BM  Denies dizziness, SOB, or CP             Bleeding is light             Pain controlled with Motrin and Tylenol             Up ad lib / ambulatory/ voiding QS  Newborn in NICU - mom pumping colostrum    O:  VS: BP 113/74   Pulse (!) 55   Temp 98.8 F (37.1 C) (Oral)   Resp 18   Ht  (1.676 m)   Wt 71.7 kg (158 lb)   LMP 12/11/2016   SpO2 100%   BMI 25.50 kg/m    LABS:               Recent Labs  08/05/17 0548 08/06/17 0547  WBC 13.5* 16.3*  HGB 13.4 12.5  PLT 243 255               Bloodtype: --/--/A POS (10/10 0548)  Rubella: Immune                                           I&O: Intake/Output      10/10 0701 - 10/11 0700 10/11 0701 - 10/12 0700   I.V. (mL/kg) 2275 (31.7)    Total Intake(mL/kg) 2275 (31.7)    Urine (mL/kg/hr) 1425 (0.8) 900 (6.6)   Blood 199    Total Output 1624 900   Net +651 -900                     Physical Exam:             Alert and Oriented X3  Lungs: Clear and unlabored  Heart: regular rate and rhythm / no murmurs  Abdomen: soft, non-tender, non-distended, active bowel sounds in all quadrants             Fundus: firm, non-tender, U-1             Dressing: honeycomb dsg c/d/i              Incision:  approximated with sutures / no erythema / no ecchymosis / no drainage  Perineum: intact  Lochia: scant, no clots  Extremities: +1 BLE edema, no calf pain or tenderness,   A:        POD # 1 S/P Primary LTCS for severe IUGR            Doing well, stable status  P:        Routine postoperative care              Encouraged warm liquids to help with flatus  See NICU lactation   May D/C IV today  May shower   Continue current care  Possible early d/c tomorrow  Carlean Jews, MSN, CNM [facility]

## 2017-08-06 NOTE — Addendum Note (Signed)
Addendum  created 08/06/17 1729 by Orlie Pollen, CRNA   Sign clinical note

## 2017-08-07 ENCOUNTER — Ambulatory Visit (HOSPITAL_COMMUNITY)

## 2017-08-07 MED ORDER — OXYCODONE-ACETAMINOPHEN 5-325 MG PO TABS: 1.0000 | ORAL_TABLET | ORAL | 0 refills | Status: DC | PRN

## 2017-08-07 MED ORDER — IBUPROFEN 600 MG PO TABS: 600.0000 mg | ORAL_TABLET | Freq: Four times a day (QID) | ORAL | 0 refills | Status: DC

## 2017-08-07 NOTE — Discharge Summary (Signed)
Obstetric Discharge Summary Reason for Admission: cesarean section Prenatal Procedures: NST and ultrasound Intrapartum Procedures: low transverse c/s Postpartum Procedures: none Complications-Operative and Postpartum: none Hemoglobin  Date Value Ref Range Status  08/06/2017 12.5 12.0 - 15.0 g/dL Final   HCT  Date Value Ref Range Status  08/06/2017 35.3 (L) 36.0 - 46.0 % Final    Physical Exam:  General: alert and cooperative Lochia: appropriate Uterine Fundus: firm Incision: healing well DVT Evaluation: No evidence of DVT seen on physical exam.  Discharge Diagnoses: severe iugr  Discharge Information: Date: 08/07/2017 Activity: pelvic rest and lifting restrictions Diet: routine Medications: Ibuprofen and Percocet Condition: stable Instructions: refer to practice specific booklet Discharge to: home Follow-up Information    Olivia Mackie, MD Follow up.   Specialty:  Obstetrics and Gynecology Contact information: [address] [place] [address] [phone]           Newborn Data: Live born female  Birth Weight: 2 lb 15.3 oz (1340 g) APGAR: 6, 9  Newborn Delivery   Birth date/time:  08/05/2017 14:54:00 Delivery type:  C-Section, Low Transverse  C-section categorization:  Primary     In NICU  Vick Frees 08/07/2017, 12:21 PM

## 2017-08-07 NOTE — Progress Notes (Signed)
Discharge instructions and prescription given to pt. Questions answered, Pt states understanding. Signs and given copy.

## 2017-08-07 NOTE — Lactation Note (Signed)
This note was copied from a baby's chart. Lactation Consultation Note  Patient Name: Cynthia Briggs Date: 08/07/2017 Reason for consult: Follow-up assessment;NICU baby  NICU baby 42 hours old. Mom teary about going home. Mom concerned about getting a DEBP, so discussed rental through [employer]. Mom has a "hospital-grade" pump ordered through insurance, but mom not certain when she will get it or whether it will be what she needs. Enc mom to take pumping kit with her at D/C, and mom knows she will need kit to use with rental pump and pumps in NICU pumping rooms. Mom reports that she is collecting about 8 ml of EBM each time that she pumps. Enc mom to continue pumping every 2-3 hours for a total of 8-12 times/24 hours followed by hand expression. Discussed the importance of hand expression to EBM volume. Discussed benefit of holding baby STS, and enc mom to offer STS and nuzzling/latching as she and baby able. Mom given additional colostrum containers and coconut oil, and is aware of additional bottles located in pumping rooms on NICU. Mom aware of OP/BFSG and [place] phone line assistance after D/C.   Maternal Data    Feeding Feeding Type: Breast Milk Length of feed: 30 min  LATCH Score                   Interventions    Lactation Tools Discussed/Used     Consult Status Consult Status: PRN    Andres Labrum 08/07/2017, 10:46 AM

## 2017-08-07 NOTE — Progress Notes (Signed)
No c/o; tol po, ambulating; passing flatus; pain controlled; light lochia Pt wants d/c home - infant in NICU but wants to be home with her daughters since has been in hospital for a prolonged period  Temp:  [98.2 F (36.8 C)] 98.2 F (36.8 C) (10/11 2145) Pulse Rate:  [60-62] 62 (10/11 2145) Resp:  [15-18] 15 (10/11 2325) BP: (114-115)/(71-73) 114/71 (10/11 2145) SpO2:  [99 %] 99 % (10/11 1736)   A&ox3 rrr ctab Abd: soft, nt, fundus firm and 2cm below umb; no edema, nt bilat  LE: no edema, nt   CBC Latest Ref Rng & Units 08/06/2017 08/05/2017 07/24/2017  WBC 4.0 - 10.5 K/uL 16.3(H) 13.5(H) 9.6  Hemoglobin 12.0 - 15.0 g/dL 96.0 45.4 09.8  Hematocrit 36.0 - 46.0 % 35.3(L) 36.9 39.6  Platelets 150 - 400 K/uL 255 243 274   A/P: pod 2 s/p 1ltcs for severe iugr 1. Doing well, ok for d/c home and f/u in 2 wks 2. Rh pos 3. Rubella immune 4. Infant in nicu, doing well per pt; pt is pumping

## 2017-08-10 ENCOUNTER — Ambulatory Visit: Payer: Self-pay

## 2017-08-10 NOTE — Lactation Note (Signed)
This note was copied from a baby's chart. Lactation Consultation Note  Patient Name: Cynthia Briggs [id number] Date: 08/10/2017   Mom saw [facility] in NICU and had a quick pumping questions regarding settings on pump. LC reviewed "initiate" vs "maintain" setting and duration on her [employer] DEBP rental.   Encouraged using hands-on pumping during pumping to increase milk output and hand expression at end of pumping session.   Mom is waiting for her insurance pump to arrive.   Mom did not have further questions at this time. Encouraged to call or ask questions as time progresses.     Lendon Ka 08/10/2017, 6:13 PM

## 2017-08-24 ENCOUNTER — Ambulatory Visit: Payer: Self-pay

## 2017-08-24 NOTE — Lactation Note (Signed)
This note was copied from a baby's chart. Lactation Consultation Note  Patient Name: Cynthia Briggs BoxFlorence Slane ONGEX'BToday's Date: 08/24/2017   Mom's questions about DEBP membranes answered.   Lurline HareRichey, Sharee Sturdy [facility] 08/24/2017, 9:24 PM

## 2017-09-12 ENCOUNTER — Ambulatory Visit: Payer: Self-pay

## 2017-09-12 NOTE — Lactation Note (Signed)
This note was copied from a baby's chart. MOB was expressing concern that maybe breastfeeding was holding Cynthia Briggs back from going home and was too stressful for him. I assured her that it would be fine to continue to breastfeed and that he will get better and better with his feedings and probably won't be here too much longer . She really enjoys breastfeeding and so does the baby.It seems like the feeding plan may be a bit complicated for Mom and she doesn't understand why he isn't doing better with nipple feeding. I told her that all babies go at different speeds on learning to nipple and take whole feedings and that I have seen him improving as time goes by. Mom seemed somewhat encouraged by this and plans to keep up with the breastfeeding awhile longer. I told her it would be so much more satisfying for her and Cynthia Briggs once he goes home and isn't in the stressful atmosphere of the NICU.

## 2017-12-10 ENCOUNTER — Encounter (HOSPITAL_COMMUNITY): Payer: Self-pay

## 2018-11-11 ENCOUNTER — Encounter (HOSPITAL_COMMUNITY): Payer: Self-pay

## 2018-11-11 ENCOUNTER — Ambulatory Visit (HOSPITAL_COMMUNITY)
Admission: EM | Admit: 2018-11-11 | Discharge: 2018-11-11 | Disposition: A | Discharge status: Home / Self Care | Attending: Internal Medicine | Admitting: Internal Medicine

## 2018-11-11 DIAGNOSIS — J020 Streptococcal pharyngitis: Secondary | ICD-10-CM

## 2018-11-11 LAB — POCT RAPID STREP A: Streptococcus, Group A Screen (Direct): POSITIVE — AB

## 2018-11-11 MED ORDER — AMOXICILLIN 500 MG PO CAPS: 1000.0000 mg | ORAL_CAPSULE | Freq: Every day | ORAL | 0 refills | Status: AC

## 2018-11-11 MED ORDER — PENICILLIN G BENZATHINE 1200000 UNIT/2ML IM SUSP
INTRAMUSCULAR | Status: AC
  Filled 2018-11-11: qty 2

## 2018-11-11 MED ORDER — PENICILLIN G BENZATHINE 1200000 UNIT/2ML IM SUSP
1.2000 10*6.[IU] | Freq: Once | INTRAMUSCULAR | Status: AC
  Administered 2018-11-11: 1.2 10*6.[IU] via INTRAMUSCULAR

## 2018-11-11 NOTE — ED Triage Notes (Signed)
Pt present sore throat, fever and chills.  Symptoms started yesterday

## 2018-11-11 NOTE — ED Provider Notes (Signed)
[facility]    CSN: [id number] Arrival date & time: 11/11/18  1243     History   Chief Complaint Chief Complaint  Patient presents with  . Sore Throat  . Chills    HPI Cynthia Briggs is a 33 y.o. female.   She is a 33 year old female that presents with sore throat, chills, body aches since yesterday.  Her symptoms have been constant and remain the same.  She has  not anything for her symptoms.  She did have recent sick contact with her sister that was positive for strep.  She also has 3 small children.  She denies any cough, congestion, night sweats, nausea, vomiting, diarrhea.  She is not having trouble swallowing or breathing.  She does have mild pain with swallowing.  ROS per HPI      Past Medical History:  Diagnosis Date  . Medical history non-contributory     Patient Active Problem List   Diagnosis Date Noted  . Cesarean delivery delivered: indication severe IUGR 08/05/2017  . Postpartum care following cesarean delivery (10/10) 08/05/2017  . IUGR (intrauterine growth restriction) affecting care of mother 07/24/2017  . [redacted] weeks gestation of pregnancy   . Abnormal findings on antenatal screening   . Post-dates pregnancy 04/13/2015  . Postpartum care following vaginal delivery-waterbirth (6/17) 04/13/2015    Past Surgical History:  Procedure Laterality Date  . CESAREAN SECTION N/A 08/05/2017   Procedure: Primary CESAREAN SECTION;  Surgeon: Olivia Mackieaavon, Richard, MD;  [facility];  Service: Obstetrics;  Laterality: N/A;  EDD: 09/17/17 Allergy: Codeine  . wart removed    . WISDOM TOOTH EXTRACTION      OB History    Gravida  3   Para  2   Term  2   Preterm      AB      Living  2     SAB      TAB      Ectopic      Multiple  0   Live Births  1            Home Medications    Prior to Admission medications   Medication Sig Start Date End Date Taking? Authorizing Provider  amoxicillin (AMOXIL) 500 MG capsule Take  2 capsules (1,000 mg total) by mouth daily for 10 days. 11/11/18 11/21/18  Janace ArisBast, Venecia Mehl A, NP  Prenatal Vit-Fe Fumarate-FA (PRENATAL MULTIVITAMIN) TABS tablet Take 1 tablet by mouth at bedtime.    [provider]  Probiotic Product (PROBIOTIC PO) Take 1 capsule by mouth daily.     [provider]    Family History History reviewed. No pertinent family history.  Social History Social History   Tobacco Use  . Smoking status: Never Smoker  . Smokeless tobacco: Never Used  Substance Use Topics  . Alcohol use: No  . Drug use: No     Allergies   Codeine   Review of Systems Review of Systems   Physical Exam Triage Vital Signs ED Triage Vitals  Enc Vitals Group     BP 11/11/18 1306 107/64     Pulse Rate 11/11/18 1306 91     Resp 11/11/18 1306 16     Temp 11/11/18 1306 99.1 F (37.3 C)     Temp Source 11/11/18 1306 Oral     SpO2 11/11/18 1306 100 %     Weight --      Height --      Head Circumference --  Peak Flow --      Pain Score 11/11/18 1307 8     Pain Loc --      Pain Edu? --      Excl. in GC? --    No data found.  Updated Vital Signs BP 107/64 (BP Location: Right Arm)   Pulse 91   Temp 99.1 F (37.3 C) (Oral)   Resp 16   LMP 10/07/2018   SpO2 100%   Visual Acuity Right Eye Distance:   Left Eye Distance:   Bilateral Distance:    Right Eye Near:   Left Eye Near:    Bilateral Near:     Physical Exam Vitals signs and nursing note reviewed.  Constitutional:      General: She is not in acute distress.    Appearance: She is well-developed. She is not ill-appearing, toxic-appearing or diaphoretic.  HENT:     Head: Normocephalic and atraumatic.     Right Ear: Tympanic membrane and ear canal normal.     Left Ear: Tympanic membrane and ear canal normal.     Mouth/Throat:     Pharynx: Posterior oropharyngeal erythema present.     Tonsils: Tonsillar exudate present. Swelling: 2+ on the right. 2+ on the left.  Eyes:      Conjunctiva/sclera: Conjunctivae normal.  Cardiovascular:     Rate and Rhythm: Normal rate and regular rhythm.  Pulmonary:     Effort: Pulmonary effort is normal.     Breath sounds: Normal breath sounds.  Skin:    General: Skin is warm and dry.  Neurological:     Mental Status: She is alert.      UC Treatments / Results  Labs (all labs ordered are listed, but only abnormal results are displayed) Labs Reviewed  POCT RAPID STREP A - Abnormal; Notable for the following components:      Result Value   Streptococcus, Group A Screen (Direct) POSITIVE (*)    All other components within normal limits    EKG None  Radiology No results found.  Procedures Procedures (including critical care time)  Medications Ordered in UC Medications  penicillin g benzathine (BICILLIN LA) 1200000 UNIT/2ML injection 1.2 Million Units (has no administration in time range)    Initial Impression / Assessment and Plan / UC Course  I have reviewed the triage vital signs and the nursing notes.  Pertinent labs & imaging results that were available during my care of the patient were reviewed by me and considered in my medical decision making (see chart for details).     Rapid strep test positive We will treat with penicillin injection in clinic Tylenol/ibuprofen for pain or fever Follow up as needed for continued or worsening symptoms  Final Clinical Impressions(s) / UC Diagnoses   Final diagnoses:  Strep pharyngitis     Discharge Instructions     Strep test was positive We will go ahead and treat with amoxicillin daily for the next 10 days You can take tylenol for the pain and fever.     ED Prescriptions    Medication Sig Dispense Auth. Provider   amoxicillin (AMOXIL) 500 MG capsule Take 2 capsules (1,000 mg total) by mouth daily for 10 days. 20 capsule Dahlia ByesBast, Laquanna Veazey A, NP     Controlled Substance Prescriptions [place] Controlled Substance Registry consulted? Not Applicable   Janace ArisBast,  Lorena Clearman A, NP 11/11/18 1426

## 2018-11-11 NOTE — Discharge Instructions (Addendum)
Strep test was positive We will go ahead and treat with amoxicillin daily for the next 10 days You can take tylenol for the pain and fever.

## 2018-11-11 NOTE — ED Notes (Signed)
Patient able to ambulate independently  

## 2019-07-25 IMAGING — US US MFM UA CORD DOPPLER
1 series · 15 of 24 positions shown · non-contrast
Comparison: none

[Series 1: us mfm ua cord doppler · 15 of 24 slices shown]
[im 1/24]
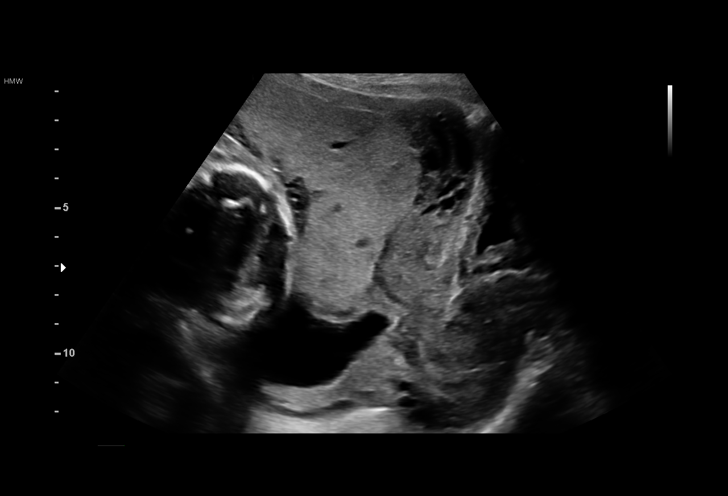
[im 3/24]
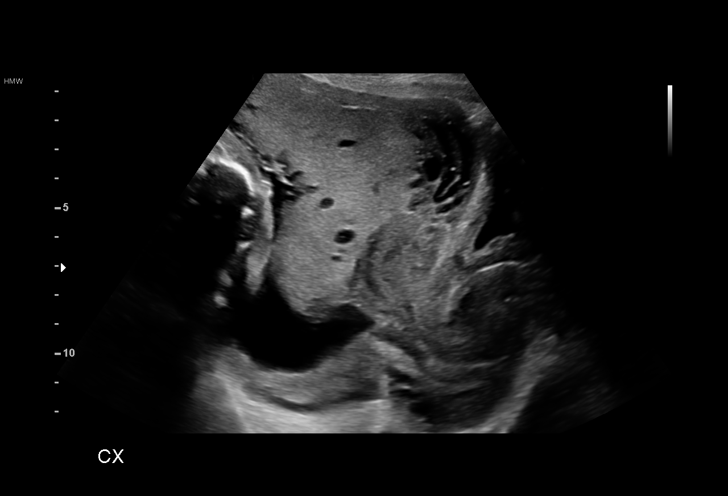
[im 5/24]
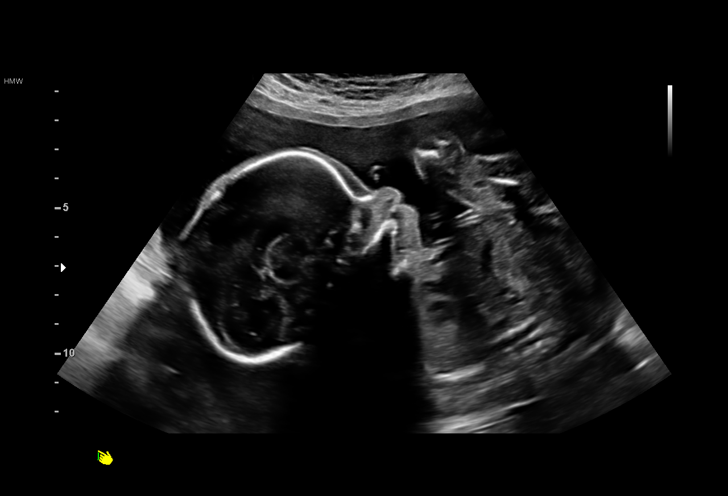
[im 6/24]
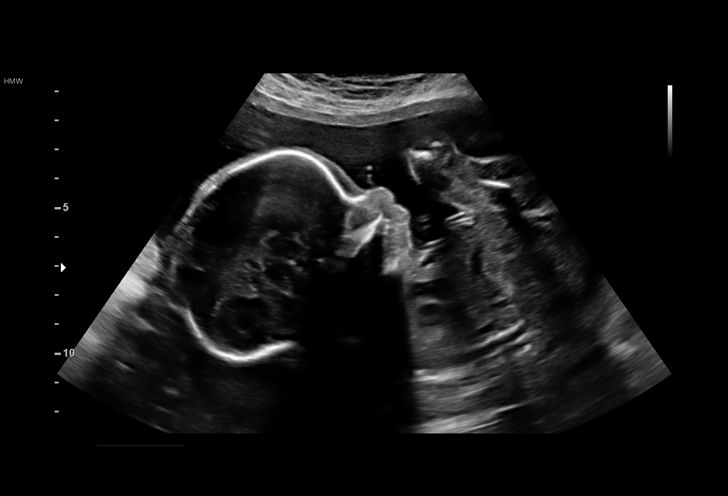
[im 8/24]
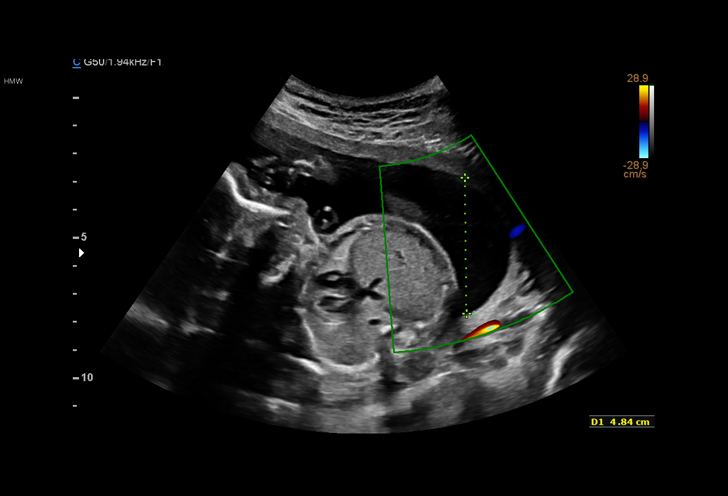
[im 9/24]
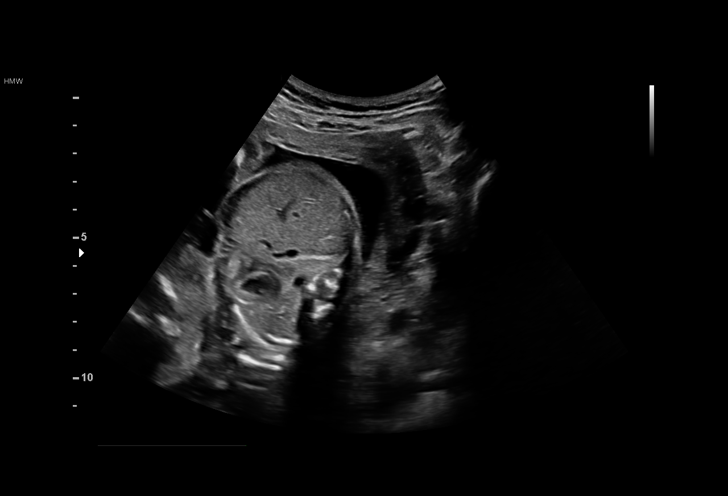
[im 11/24]
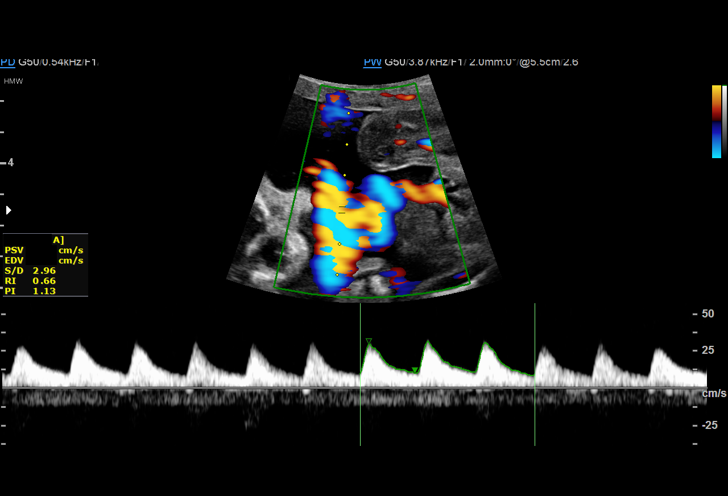
[im 13/24]
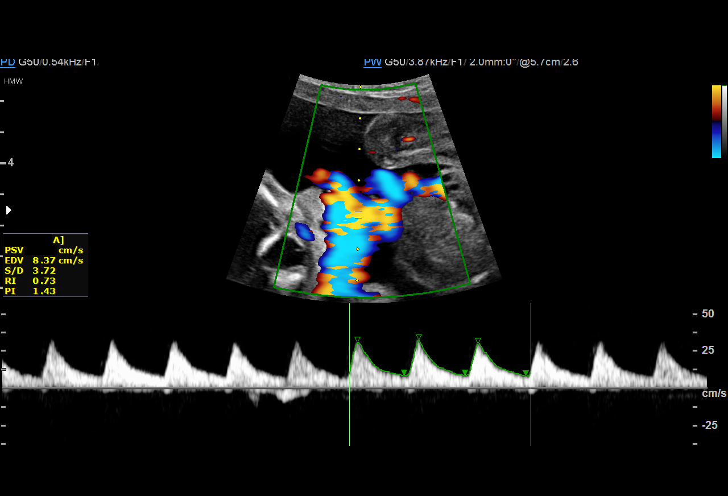
[im 14/24]
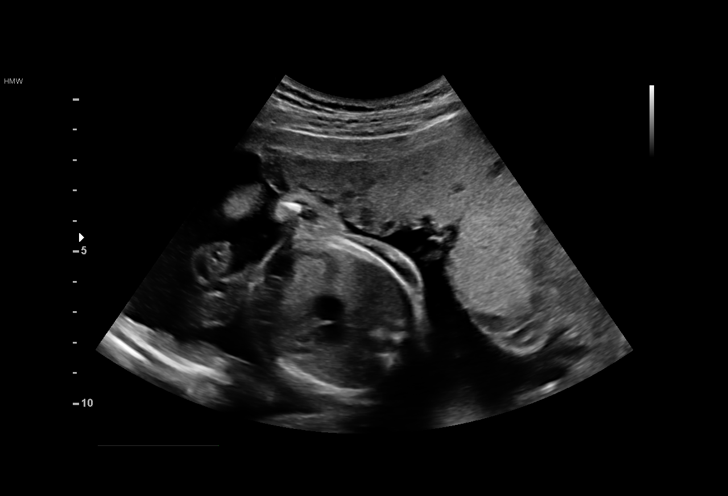
[im 16/24]
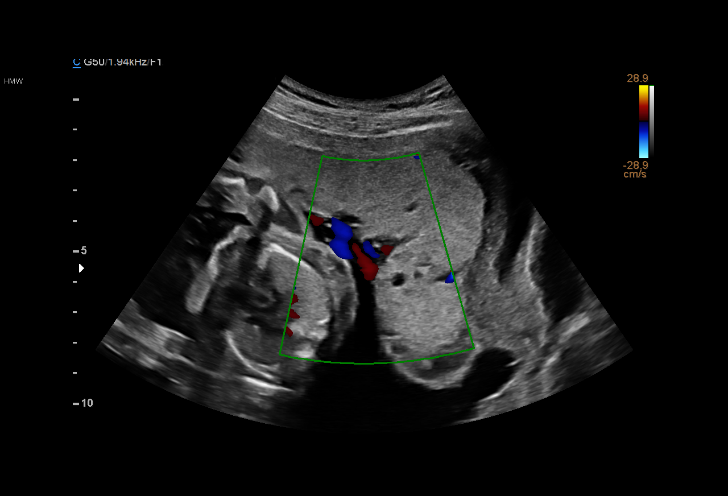
[im 17/24]
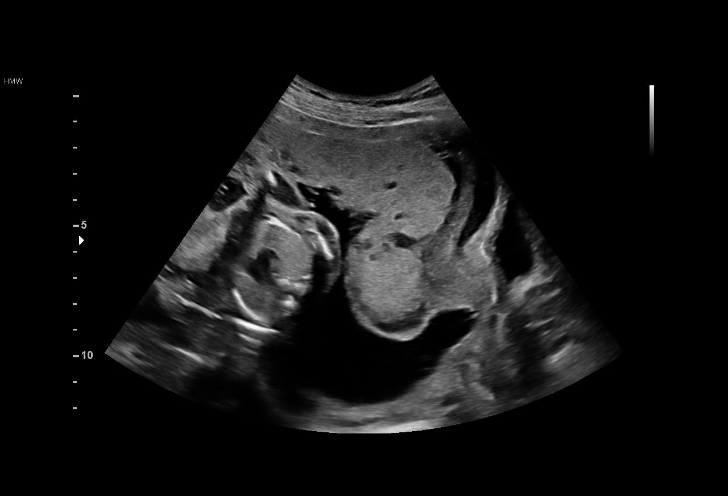
[im 19/24]
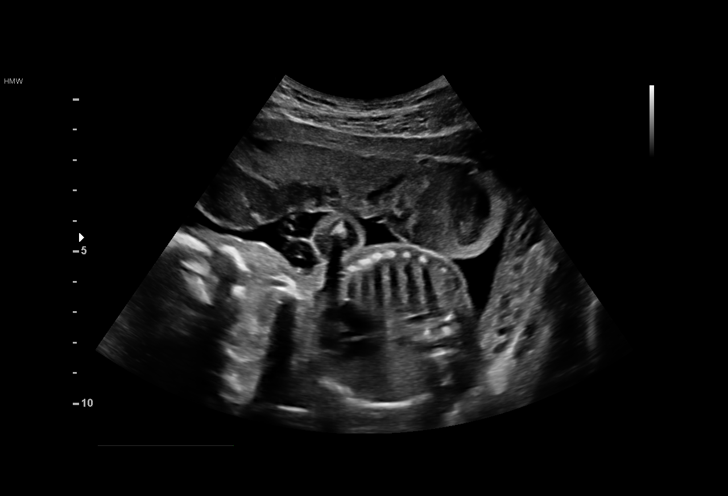
[im 21/24]
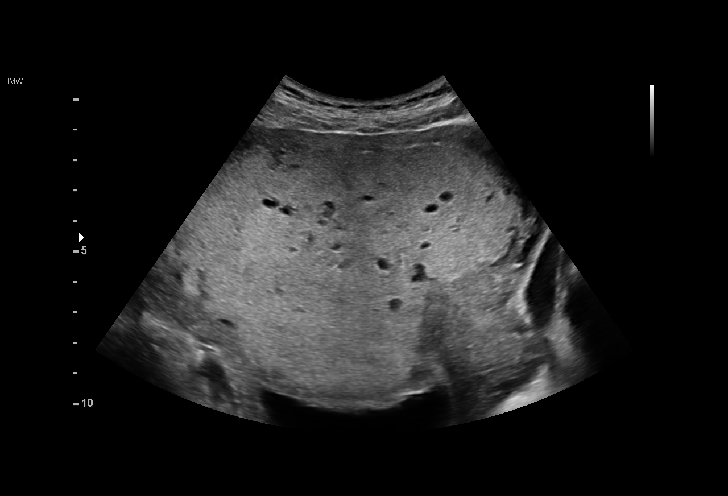
[im 22/24]
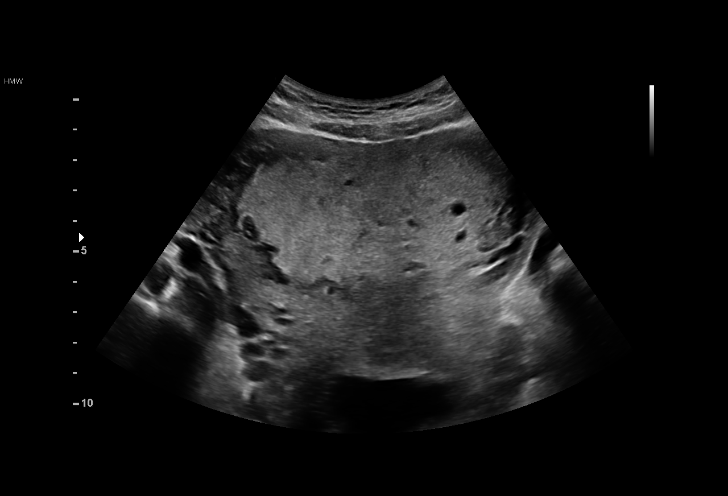
[im 24/24]
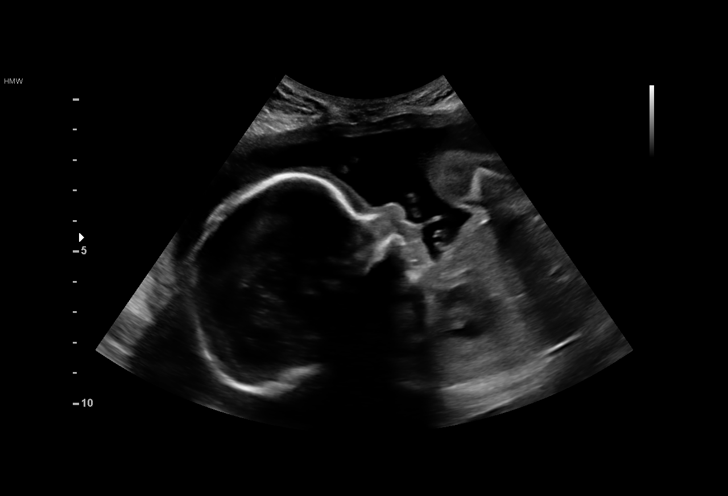

[15 of 24 positions shown; findings below may reference images not displayed]

1  BLONDINACKA SAMSONAITE            [id number]      [id number]     [id number]
2  BLONDINACKA SAMSONAITE            [id number]      [id number]     [id number]
Indications

25 weeks gestation of pregnancy
Maternal care for known or suspected poor
fetal growth, second trimester, not applicable
or unspecified
OB History

Blood Type:            Height:  5'4"   Weight (lb):  156       BMI:
Gravidity:    3         Term:   2        Prem:   0        SAB:   0
TOP:          0       Ectopic:  0        Living: 2
Fetal Evaluation

Num Of Fetuses:     1
Fetal Heart         137
Rate(bpm):
Cardiac Activity:   Observed
Presentation:       Transverse, head to maternal right
Placenta:           Right lateral, above cervical os
P. Cord Insertion:  Visualized, central

Amniotic Fluid
AFI FV:      Subjectively within normal limits

Largest Pocket(cm)
4.8
Gestational Age

LMP:           25w 1d        Date:  12/11/16                 EDD:   09/17/17
Best:          25w 1d     Det. By:  LMP  (12/11/16)          EDD:   09/17/17
Doppler - Fetal Vessels

Umbilical Artery
S/D     %tile     RI              PI              PSV    ADFV    RDFV
(cm/s)
3.69       63   0.73             1.35             27.73       N       N

Cervix Uterus Adnexa

Cervix
Length:              4  cm.
Normal appearance by transabdominal scan.

Uterus
No abnormality visualized.

Left Ovary
No adnexal mass visualized.

Right Ovary
No adnexal mass visualized.

Cul De Sac:   No free fluid seen.

Adnexa:       No abnormality visualized.
Impression

IUP at 25+1 weeks with IUGR
Normal fetal movement and cardiac activity
Normal amniotic fluid levels
Umbilical artery dopplers were normal in four interrogations
Recommendations

Continue weekly evaluations for fetal wellbeing

## 2019-08-15 IMAGING — US US MFM UA CORD DOPPLER
1 series · 12 of 28 positions shown · non-contrast
Comparison: none

[Series 1: us mfm ua cord doppler · 36 acquisitions, 12 frames shown]
[im 2/36]
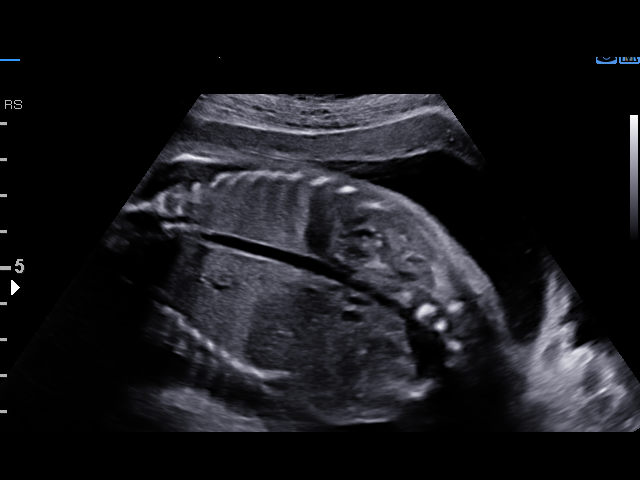
[im 4/36]
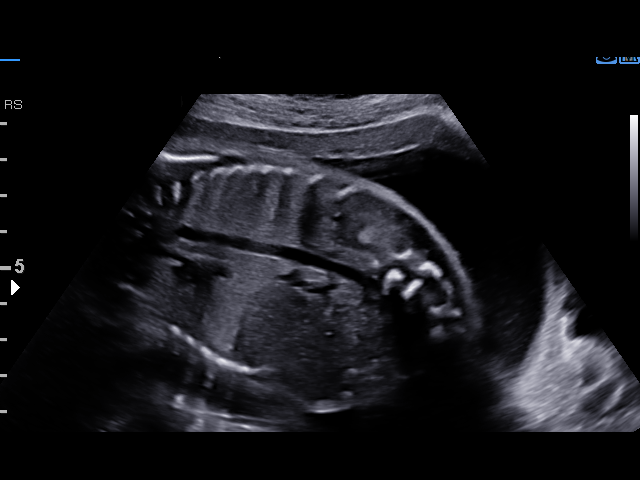
[im 7/36]
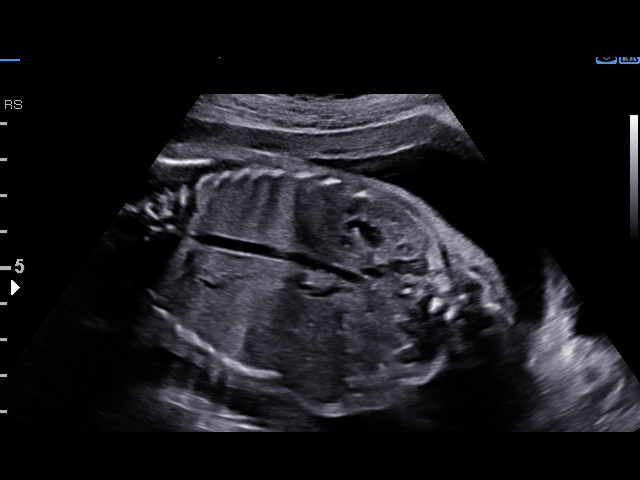
[im 11/36]
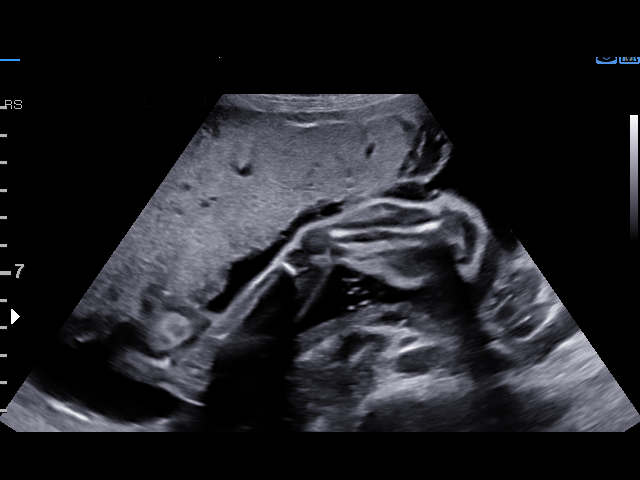
[im 13/36]
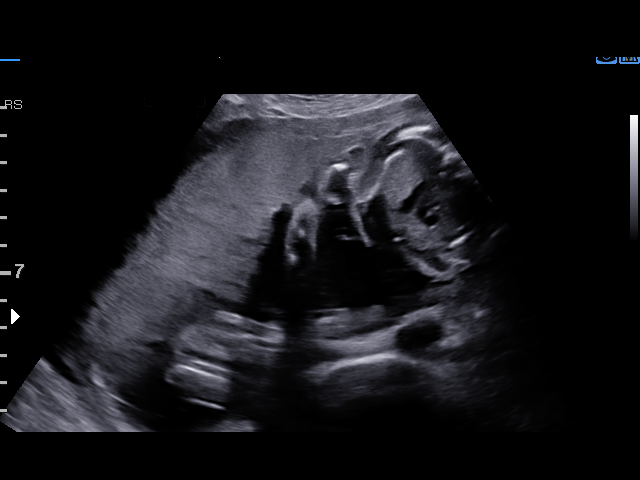
[im 16/36]
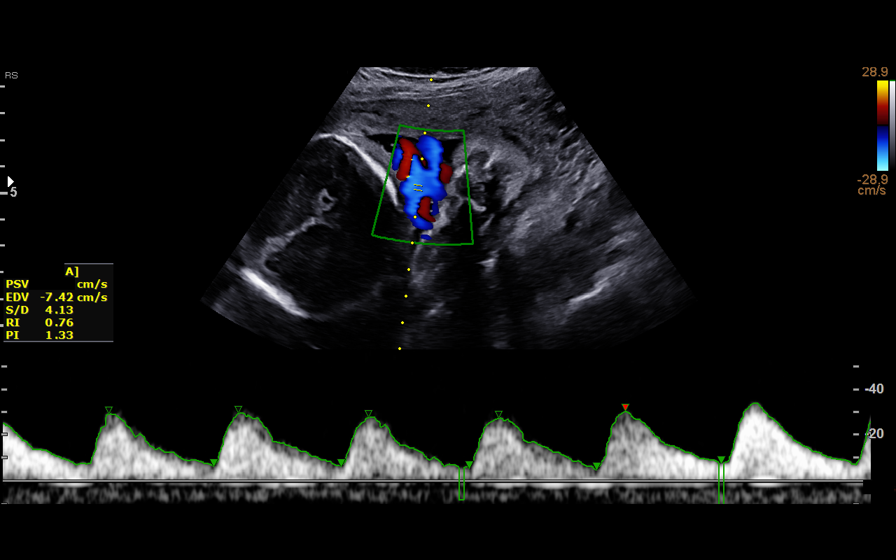
[im 20/36]
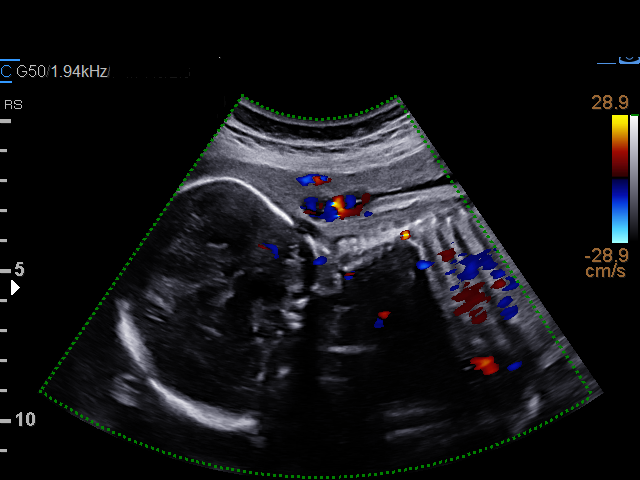
[im 23/36]
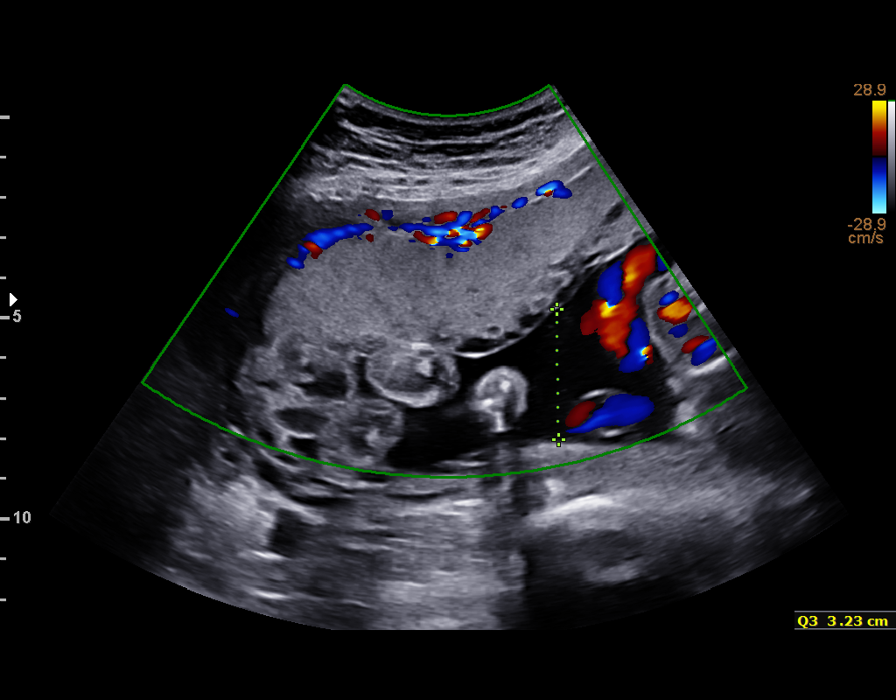
[im 25/36]
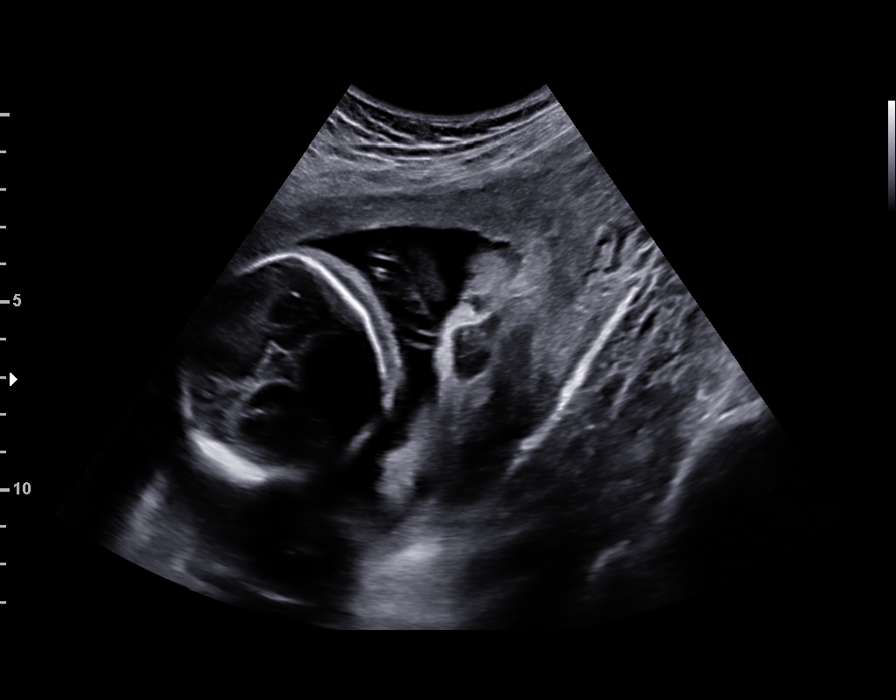
[im 29/36]
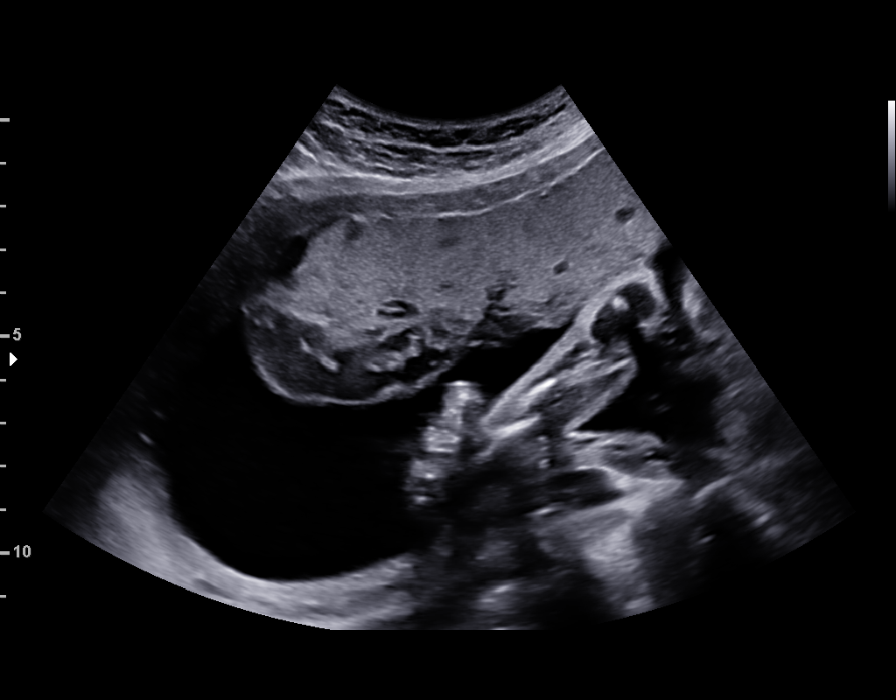
[im 32/36]
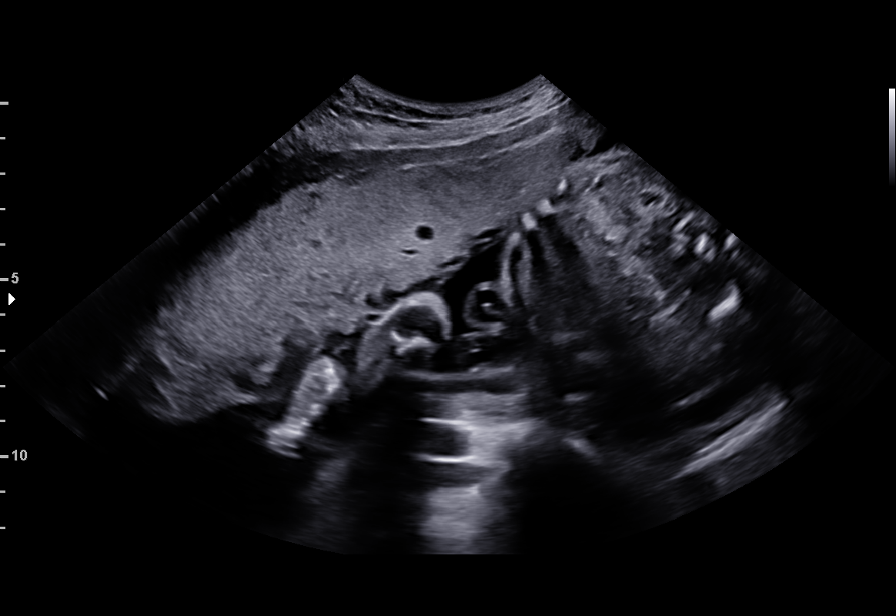
[im 34/36]
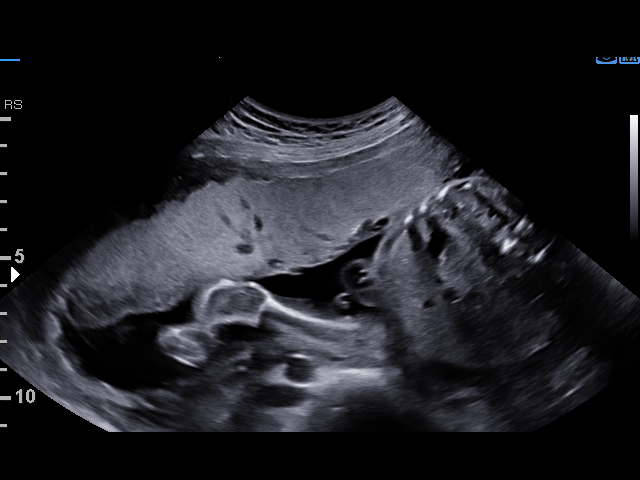

[12 of 28 positions shown; findings below may reference images not displayed]

1  [place]              [id number]      [id number]     [id number]
2  [place]              [id number]      [phone]     [id number]
Indications

28 weeks gestation of pregnancy
Fetal abnormality - other known or
suspected (short upper and lower limbs)
Maternal care for known or suspected poor
fetal growth, third trimester, not applicable or
unspecified
OB History

Blood Type:            Height:  5'4"   Weight (lb):  156      BMI:
Gravidity:    3         Term:   2        Prem:   0        SAB:   0
TOP:          0       Ectopic:  0        Living: 2
Fetal Evaluation

Num Of Fetuses:     1
Fetal Heart         138
Rate(bpm):
Cardiac Activity:   Observed
Presentation:       Transverse, head to maternal left
Placenta:           Anterior, above cervical os

Amniotic Fluid
AFI FV:      Subjectively within normal limits

AFI Sum(cm)     %Tile       Largest Pocket(cm)
17.33           65

RUQ(cm)       RLQ(cm)       LUQ(cm)        LLQ(cm)
5.42
Biophysical Evaluation

Amniotic F.V:   Pocket => 2 cm two         F. Tone:        Observed
planes
F. Movement:    Observed                   Score:          [DATE]
F. Breathing:   Observed
Gestational Age

LMP:           28w 1d       Date:   12/11/16                 EDD:   09/17/17
Best:          28w 1d    Det. By:   LMP  (12/11/16)          EDD:   09/17/17
Doppler - Fetal Vessels

Umbilical Artery
S/D     %tile     RI              PI              PSV    ADFV    RDFV
(cm/s)
4.27       93   0.77             1.42             36.98      No      No

Impression

Single living intrauterine pregnancy at 76w6d by LMP
consistent with early US.
active fetus
transverse presentation.
Anterior placenta without evidence of previa.
Normal amniotic fluid volume.
Normal interval fetal anatomy.
UA Dopplers are upper normal (approaching +1SD)
BPP [DATE]
Recommendations

BMZ course if Dopplers become abnormal
weekly BPP and UA Dopplers.
Follow-up ultrasounds for growth every 3 weeks.
Fetal movement precautions reviewed.
Understands risk of IUFD, indicated preterm delivery,
possible Cesarean delivery. All questions answered.

## 2019-09-22 IMAGING — US US MFM FETAL BPP W/O NON-STRESS
1 series · 14 of 28 positions shown · non-contrast
Comparison: none

[Series 1: us mfm fetal bpp w/o non-stress · 52 acquisitions, 14 frames shown]
[im 2/52]
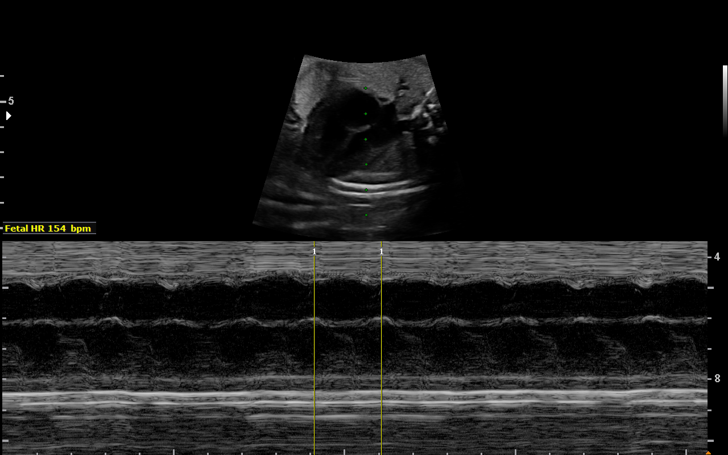
[im 6/52]
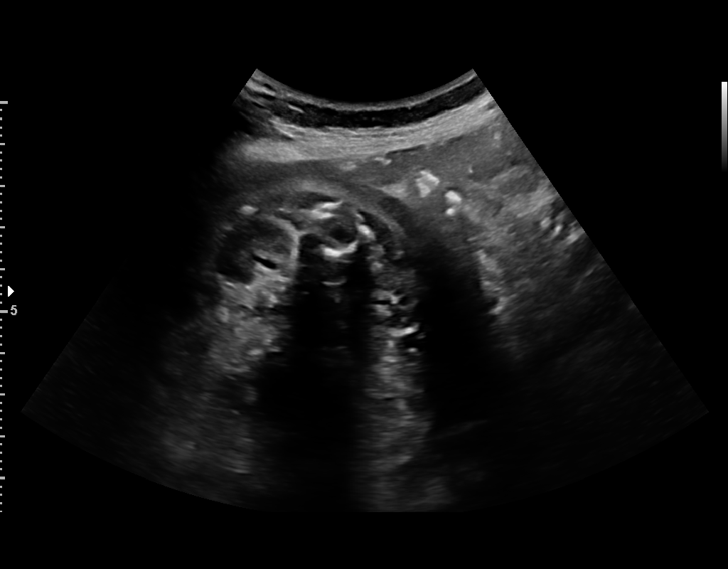
[im 10/52]
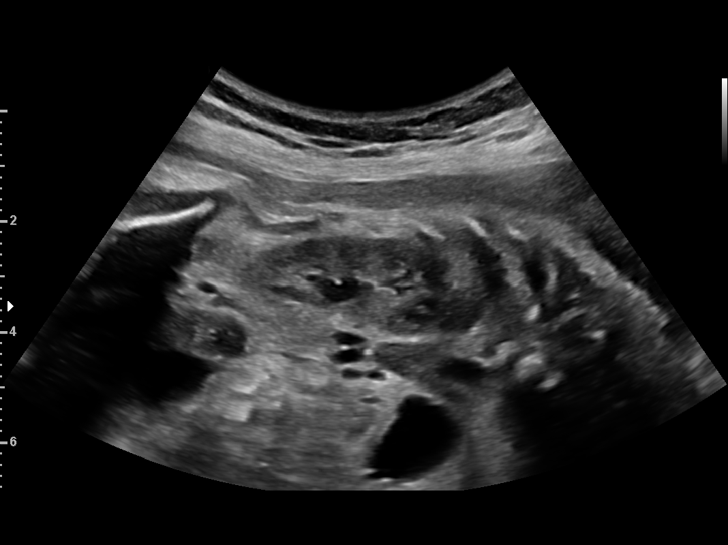
[im 14/52]
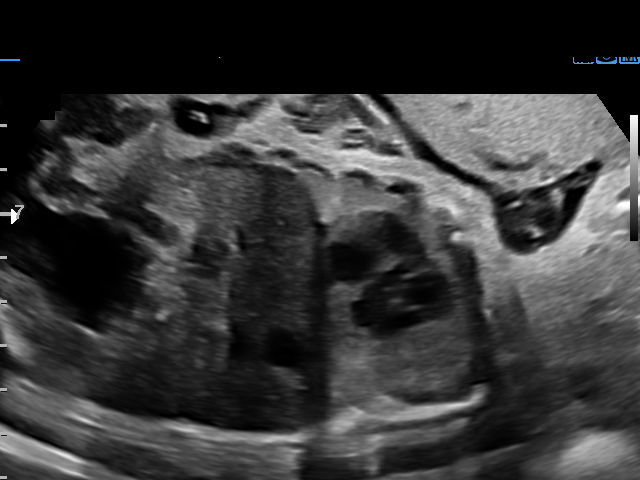
[im 18/52]
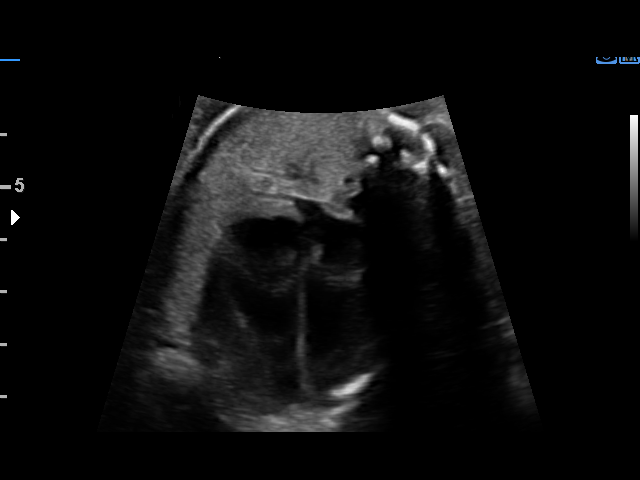
[im 21/52]
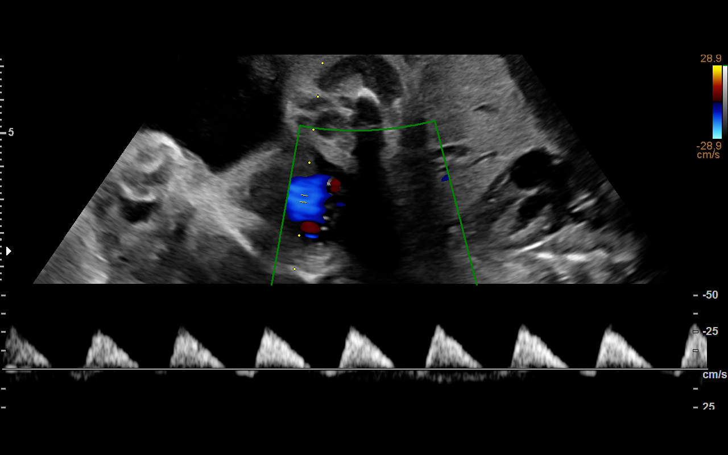
[im 25/52]
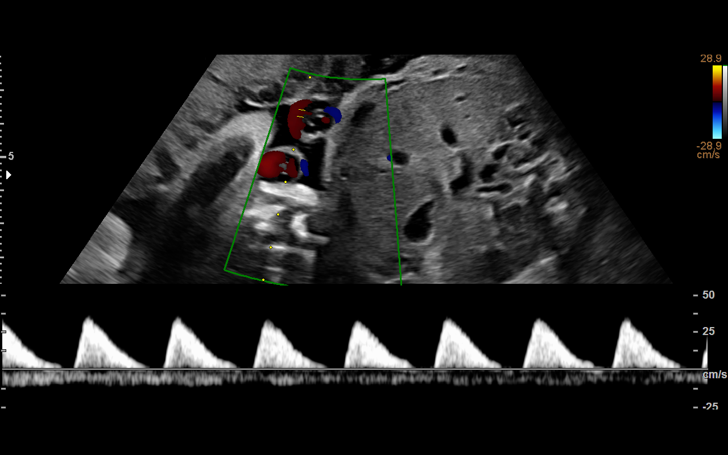
[im 29/52]
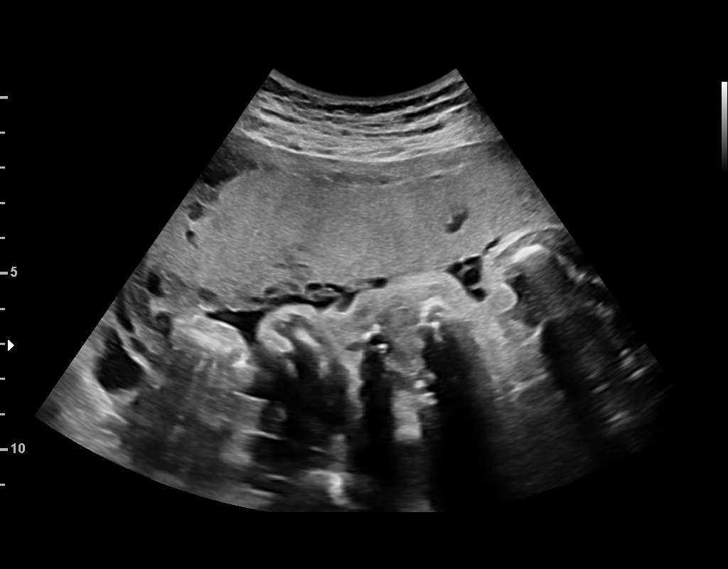
[im 33/52]
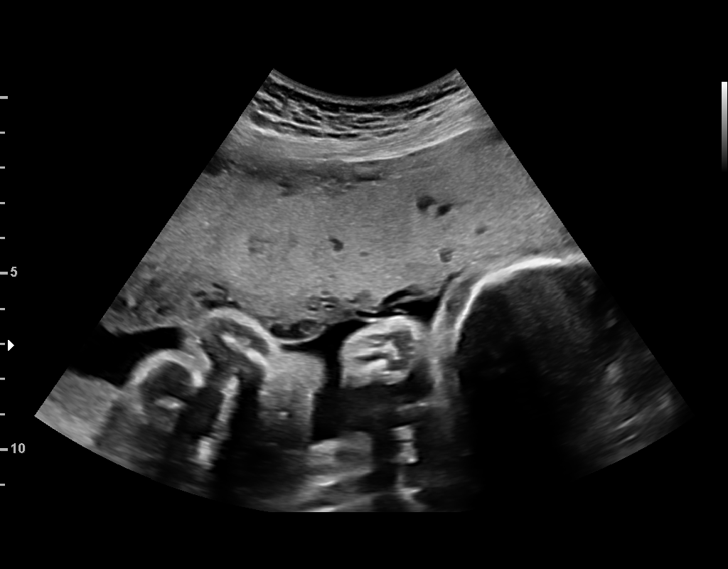
[im 36/52]
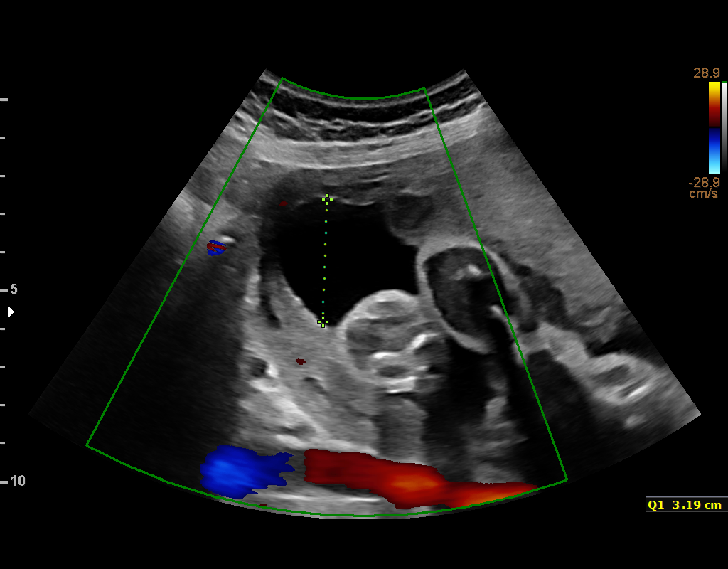
[im 40/52]
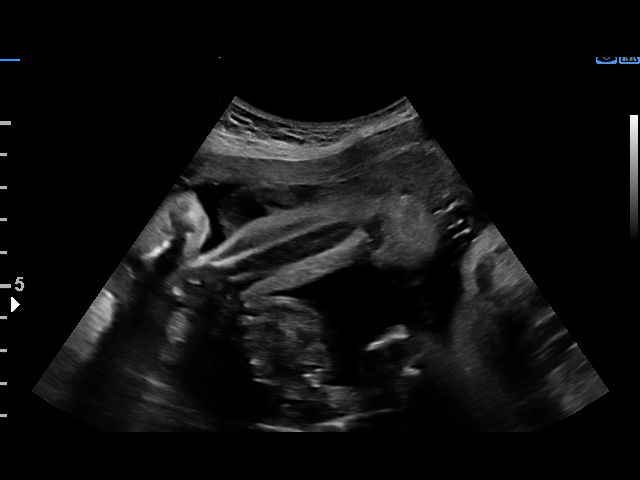
[im 44/52]
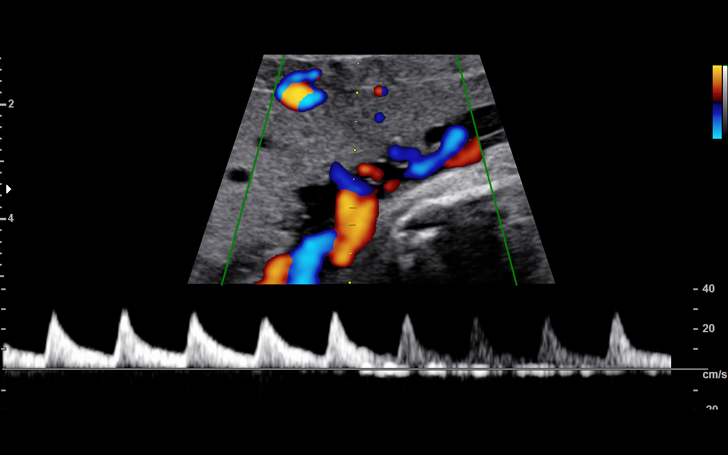
[im 48/52]
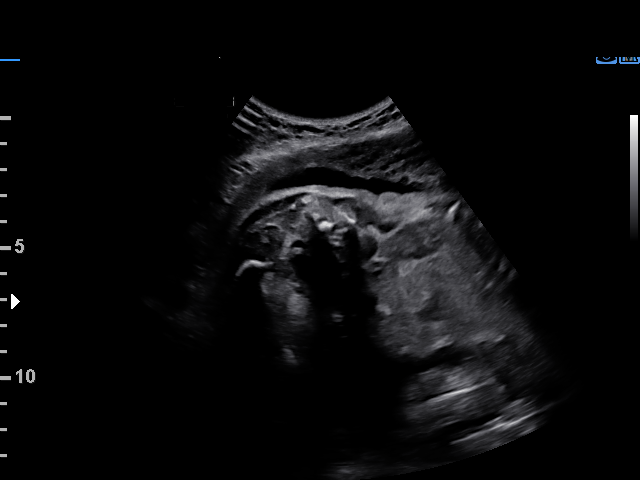
[im 52/52]
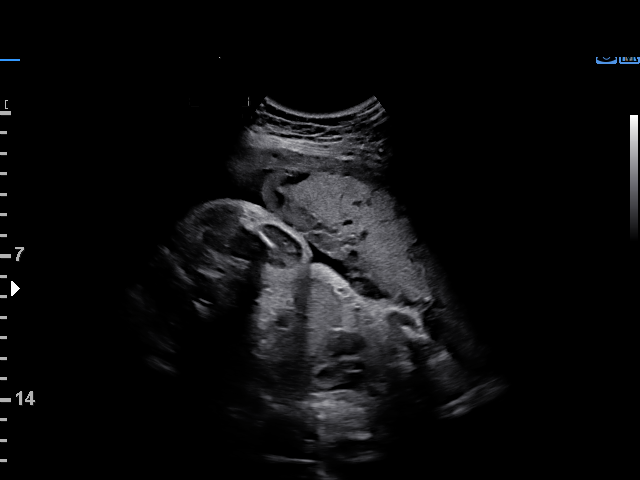

[14 of 28 positions shown; findings below may reference images not displayed]

& Infertility
3671 [REDACTED]
Attending:        Ramiro Ignacio Waitoto       Secondary Phy.:   3rd Nursing- HR
OB

1  NAZARETH JUMPER           [id number]      [phone]     [id number]
2  NAZARETH JUMPER           [id number]      [phone]     [id number]
Indications

33 weeks gestation of pregnancy
Maternal care for known or suspected poor
fetal growth, third trimester, not applicable or
unspecified
OB History

Blood Type:            Height:  5'4"   Weight (lb):  156       BMI:
Gravidity:    3         Term:   2        Prem:   0        SAB:   0
TOP:          0       Ectopic:  0        Living: 2
Fetal Evaluation

Num Of Fetuses:     1
Fetal Heart         154
Rate(bpm):
Cardiac Activity:   Observed
Presentation:       Cephalic
Placenta:           Anterior, above cervical os

Amniotic Fluid
AFI FV:      Subjectively within normal limits

AFI Sum(cm)     %Tile       Largest Pocket(cm)
10.86           25

RUQ(cm)       RLQ(cm)       LUQ(cm)        LLQ(cm)
3.19
Biophysical Evaluation

Amniotic F.V:   Within normal limits       F. Tone:        Observed
F. Movement:    Observed                   Score:          [DATE]
F. Breathing:   Observed
Gestational Age

LMP:           33w 4d        Date:  12/11/16                 EDD:   09/17/17
Best:          33w 4d     Det. By:  LMP  (12/11/16)          EDD:   09/17/17
Anatomy

Stomach:               Appears normal, left   Bladder:                Appears normal
sided
Kidneys:               Appear normal
Doppler - Fetal Vessels

Umbilical Artery
S/D     %tile                                            ADFV    RDFV
4.35    > 97.5                                              Yes      No

Cervix Uterus Adnexa

Cervix
Not visualized (advanced GA >41wks)
Impression

SIUP at 33+4 weeks
Fetal growth restriction
Normal amniotic fluid volume
BPP [DATE]
UA dopplers: tracings with elevated S/D ratios, continuous
diastolic flow; others with absent diastolic flow
Recommendations

Continue current management

## 2019-10-28 NOTE — L&D Delivery Note (Signed)
Delivery Note:   [id number] at [redacted]w[redacted]d  Admitting diagnosis: Normal labor [O80, Z37.9] / TOLAC  Risks: hx C/S for breech/IUGR Onset of labor: 3/8 at 0500 Augmentation: none ROM: SROM 2nd stage labor, clear AF  Complete dilation at 01/02/2020  1251 Onset of pushing at 1251 FHR second stage reassuring by intermittent auscultation  Analgesia /Anesthesia intrapartum: none Pushing in HK position with CNM and L&D staff support, Ivin Booty FOB and Trula Ore (doula) present for birth and supportive.  Delivery of a Live born female  Birth Weight:  4167 gm / 9'3" APGAR: 9, 9  Newborn Delivery   Birth date/time: 01/02/2020 12:57:00 Delivery type: VBAC, Spontaneous      in cephalic presentation, position OA to ROT.   Nuchal Cord: No  Cord double clamped after cessation of pulsation, cut by FOB..  Collection of cord blood for typing completed. Cord blood donation-None  Arterial cord blood sample-No    Placenta delivered-Spontaneous  with 3 vessels . Uterotonics: Pitocin IM Placenta to L&D for disposal. Uterine tone firm, bleeding minimal initial, then small trickle with LUS clots during repair  3rd degree  laceration identified. OB on call consulted for repair.  Episiotomy:None  Local analgesia: 1% lido, Nitrous gas used during repair, Toradol and Stadol IM   Repair: please see Dr. Juliene Pina note Est. Blood Loss (mL):616.00   Complications: None  Mom to postpartum.  Baby Carley Hammed to Couplet care / Skin to Skin.  Delivery Report:   Review the Delivery Report for details.     Signed: Neta Mends, CNM, MSN 01/02/2020, 6:43 PM

## 2020-01-02 ENCOUNTER — Encounter (HOSPITAL_COMMUNITY): Payer: Self-pay | Admitting: Obstetrics

## 2020-01-02 ENCOUNTER — Encounter (HOSPITAL_COMMUNITY): Payer: Self-pay | Admitting: Obstetrics and Gynecology

## 2020-01-02 ENCOUNTER — Inpatient Hospital Stay (HOSPITAL_COMMUNITY)
Admission: AD | Admit: 2020-01-02 | Discharge: 2020-01-03 | DRG: 768 | Disposition: A | Discharge status: Home / Self Care | Source: Ambulatory Visit | Attending: Obstetrics & Gynecology | Admitting: Obstetrics & Gynecology

## 2020-01-02 ENCOUNTER — Other Ambulatory Visit: Payer: Self-pay

## 2020-01-02 DIAGNOSIS — D62 Acute posthemorrhagic anemia: Secondary | ICD-10-CM | POA: Diagnosis not present

## 2020-01-02 DIAGNOSIS — Z3A41 41 weeks gestation of pregnancy: Secondary | ICD-10-CM

## 2020-01-02 DIAGNOSIS — O34219 Maternal care for unspecified type scar from previous cesarean delivery: Secondary | ICD-10-CM | POA: Diagnosis present

## 2020-01-02 DIAGNOSIS — O34211 Maternal care for low transverse scar from previous cesarean delivery: Secondary | ICD-10-CM | POA: Diagnosis present

## 2020-01-02 DIAGNOSIS — O134 Gestational [pregnancy-induced] hypertension without significant proteinuria, complicating childbirth: Secondary | ICD-10-CM | POA: Diagnosis present

## 2020-01-02 DIAGNOSIS — Z20822 Contact with and (suspected) exposure to covid-19: Secondary | ICD-10-CM | POA: Diagnosis present

## 2020-01-02 DIAGNOSIS — O480 Post-term pregnancy: Secondary | ICD-10-CM | POA: Diagnosis present

## 2020-01-02 DIAGNOSIS — O9081 Anemia of the puerperium: Secondary | ICD-10-CM | POA: Diagnosis not present

## 2020-01-02 LAB — COMPREHENSIVE METABOLIC PANEL
ALT: 20 U/L (ref 0–44)
AST: 26 U/L (ref 15–41)
Albumin: 3.1 g/dL — ABNORMAL LOW (ref 3.5–5.0)
Alkaline Phosphatase: 199 U/L — ABNORMAL HIGH (ref 38–126)
Anion gap: 9 (ref 5–15)
BUN: 11 mg/dL (ref 6–20)
CO2: 19 mmol/L — ABNORMAL LOW (ref 22–32)
Calcium: 8.9 mg/dL (ref 8.9–10.3)
Chloride: 104 mmol/L (ref 98–111)
Creatinine, Ser: 0.71 mg/dL (ref 0.44–1.00)
GFR calc Af Amer: >60 mL/min (ref >60)
GFR calc non Af Amer: >60 mL/min (ref >60)
Glucose, Bld: 84 mg/dL (ref 70–99)
Potassium: 4.5 mmol/L (ref 3.5–5.1)
Sodium: 132 mmol/L — ABNORMAL LOW (ref 135–145)
Total Bilirubin: 0.4 mg/dL (ref 0.3–1.2)
Total Protein: 6.2 g/dL — ABNORMAL LOW (ref 6.5–8.1)

## 2020-01-02 LAB — CBC
HCT: 35.8 % — ABNORMAL LOW (ref 36.0–46.0)
Hemoglobin: 12 g/dL (ref 12.0–15.0)
MCH: 29 pg (ref 26.0–34.0)
MCHC: 33.5 g/dL (ref 30.0–36.0)
MCV: 86.5 fL (ref 80.0–100.0)
Platelets: 251 10*3/uL (ref 150–400)
RBC: 4.14 MIL/uL (ref 3.87–5.11)
RDW: 12.2 % (ref 11.5–15.5)
WBC: 9.2 10*3/uL (ref 4.0–10.5)
nRBC: 0 % (ref 0.0–0.2)

## 2020-01-02 LAB — RESPIRATORY PANEL BY RT PCR (FLU A&B, COVID)
Influenza A by PCR: NEGATIVE
Influenza B by PCR: NEGATIVE
SARS Coronavirus 2 by RT PCR: NEGATIVE

## 2020-01-02 LAB — OB RESULTS CONSOLE GC/CHLAMYDIA
Chlamydia: NEGATIVE
Gonorrhea: NEGATIVE

## 2020-01-02 LAB — TYPE AND SCREEN
ABO/RH(D): A POS
Antibody Screen: NEGATIVE

## 2020-01-02 LAB — OB RESULTS CONSOLE HEPATITIS B SURFACE ANTIGEN: Hepatitis B Surface Ag: NEGATIVE

## 2020-01-02 LAB — OB RESULTS CONSOLE RUBELLA ANTIBODY, IGM: Rubella: IMMUNE

## 2020-01-02 LAB — OB RESULTS CONSOLE HIV ANTIBODY (ROUTINE TESTING): HIV: NONREACTIVE

## 2020-01-02 LAB — OB RESULTS CONSOLE GBS: GBS: NEGATIVE

## 2020-01-02 LAB — OB RESULTS CONSOLE RPR: RPR: NONREACTIVE

## 2020-01-02 LAB — ABO/RH: ABO/RH(D): A POS

## 2020-01-02 MED ORDER — DIBUCAINE (PERIANAL) 1 % EX OINT: 1.0000 "application " | TOPICAL_OINTMENT | CUTANEOUS | Status: DC | PRN

## 2020-01-02 MED ORDER — SENNOSIDES-DOCUSATE SODIUM 8.6-50 MG PO TABS
2.0000 | ORAL_TABLET | ORAL | Status: DC
  Administered 2020-01-02: 2 via ORAL
  Filled 2020-01-02: qty 2

## 2020-01-02 MED ORDER — ONDANSETRON HCL 4 MG/2ML IJ SOLN: 4.0000 mg | INTRAMUSCULAR | Status: DC | PRN

## 2020-01-02 MED ORDER — LACTATED RINGERS IV SOLN: 500.0000 mL | INTRAVENOUS | Status: DC | PRN

## 2020-01-02 MED ORDER — OXYTOCIN 10 UNIT/ML IJ SOLN
INTRAMUSCULAR | Status: AC
  Filled 2020-01-02: qty 1

## 2020-01-02 MED ORDER — SIMETHICONE 80 MG PO CHEW: 80.0000 mg | CHEWABLE_TABLET | ORAL | Status: DC | PRN

## 2020-01-02 MED ORDER — WITCH HAZEL-GLYCERIN EX PADS: 1.0000 "application " | MEDICATED_PAD | CUTANEOUS | Status: DC | PRN

## 2020-01-02 MED ORDER — OXYTOCIN 10 UNIT/ML IJ SOLN
10.0000 [IU] | Freq: Once | INTRAMUSCULAR | Status: AC
  Administered 2020-01-02 (×2): 10 [IU] via INTRAMUSCULAR
  Filled 2020-01-02: qty 1

## 2020-01-02 MED ORDER — KETOROLAC TROMETHAMINE 60 MG/2ML IM SOLN
30.0000 mg | Freq: Once | INTRAMUSCULAR | Status: AC
  Administered 2020-01-02: 30 mg via INTRAMUSCULAR
  Filled 2020-01-02: qty 2

## 2020-01-02 MED ORDER — ACETAMINOPHEN 325 MG PO TABS: 650.0000 mg | ORAL_TABLET | ORAL | Status: DC | PRN

## 2020-01-02 MED ORDER — DIPHENHYDRAMINE HCL 25 MG PO CAPS: 25.0000 mg | ORAL_CAPSULE | Freq: Four times a day (QID) | ORAL | Status: DC | PRN

## 2020-01-02 MED ORDER — COCONUT OIL OIL: 1.0000 "application " | TOPICAL_OIL | Status: DC | PRN

## 2020-01-02 MED ORDER — BENZOCAINE-MENTHOL 20-0.5 % EX AERO
1.0000 "application " | INHALATION_SPRAY | CUTANEOUS | Status: DC | PRN
  Administered 2020-01-02: 1 via TOPICAL
  Filled 2020-01-02: qty 56

## 2020-01-02 MED ORDER — PRENATAL MULTIVITAMIN CH
1.0000 | ORAL_TABLET | Freq: Every day | ORAL | Status: DC
  Filled 2020-01-02: qty 1

## 2020-01-02 MED ORDER — TETANUS-DIPHTH-ACELL PERTUSSIS 5-2.5-18.5 LF-MCG/0.5 IM SUSP: 0.5000 mL | Freq: Once | INTRAMUSCULAR | Status: DC

## 2020-01-02 MED ORDER — OXYTOCIN 10 UNIT/ML IJ SOLN: 10.0000 [IU] | Freq: Once | INTRAMUSCULAR | Status: DC

## 2020-01-02 MED ORDER — ZOLPIDEM TARTRATE 5 MG PO TABS: 5.0000 mg | ORAL_TABLET | Freq: Every evening | ORAL | Status: DC | PRN

## 2020-01-02 MED ORDER — KETOROLAC TROMETHAMINE 30 MG/ML IJ SOLN
INTRAMUSCULAR | Status: AC
  Filled 2020-01-02: qty 1

## 2020-01-02 MED ORDER — OXYTOCIN BOLUS FROM INFUSION: 500.0000 mL | Freq: Once | INTRAVENOUS | Status: DC

## 2020-01-02 MED ORDER — LACTATED RINGERS IV SOLN: INTRAVENOUS | Status: DC

## 2020-01-02 MED ORDER — OXYTOCIN 40 UNITS IN NORMAL SALINE INFUSION - SIMPLE MED: 2.5000 [IU]/h | INTRAVENOUS | Status: DC

## 2020-01-02 MED ORDER — LIDOCAINE HCL (PF) 1 % IJ SOLN
30.0000 mL | INTRAMUSCULAR | Status: AC | PRN
  Administered 2020-01-02: 30 mL via SUBCUTANEOUS
  Filled 2020-01-02: qty 30

## 2020-01-02 MED ORDER — ONDANSETRON HCL 4 MG/2ML IJ SOLN: 4.0000 mg | Freq: Four times a day (QID) | INTRAMUSCULAR | Status: DC | PRN

## 2020-01-02 MED ORDER — ONDANSETRON HCL 4 MG PO TABS: 4.0000 mg | ORAL_TABLET | ORAL | Status: DC | PRN

## 2020-01-02 MED ORDER — IBUPROFEN 600 MG PO TABS
600.0000 mg | ORAL_TABLET | Freq: Four times a day (QID) | ORAL | Status: DC
  Administered 2020-01-02 – 2020-01-03 (×3): 600 mg via ORAL
  Filled 2020-01-02 (×4): qty 1

## 2020-01-02 MED ORDER — LIDOCAINE HCL (PF) 1 % IJ SOLN
INTRAMUSCULAR | Status: AC
  Administered 2020-01-02: 30 mL
  Filled 2020-01-02: qty 30

## 2020-01-02 MED ORDER — SOD CITRATE-CITRIC ACID 500-334 MG/5ML PO SOLN: 30.0000 mL | ORAL | Status: DC | PRN

## 2020-01-02 MED ORDER — BUTORPHANOL TARTRATE 1 MG/ML IJ SOLN
1.0000 mg | Freq: Once | INTRAMUSCULAR | Status: AC
  Administered 2020-01-02: 14:00:00 1 mg via INTRAMUSCULAR
  Filled 2020-01-02: qty 1

## 2020-01-02 MED ORDER — OXYCODONE HCL 5 MG PO TABS: 5.0000 mg | ORAL_TABLET | ORAL | Status: DC | PRN

## 2020-01-02 NOTE — H&P (Addendum)
OB ADMISSION/ HISTORY & PHYSICAL:  Admission Date: 01/02/2020 11:07 AM  Admit Diagnosis: Normal labor [O80, Z37.9]    Cynthia Briggs is a 34 y.o. female presenting for active labor, seen in office today, 4.90/-1. Ctx since 5 am, stronger for past hour, and now with some bloody show. No LOF, + FM. Desires unmedicated birth, TOLAC. Proven pelvis, NSVB x 2, waterbirth. Declines continuous EFM and IV access unless needed emergently, counseled on TOLAC management guidelines during antepartum care.  Spouse Ivin Booty and doula Trula Ore present and supportive.  Denies HA/NV/RUQ pain/visual changes.    Prenatal History: [id number]  EDC : 12/24/2019, by Patient Reported  Prenatal care at [facility] since 1st trim, CNM care   Prenatal course complicated by: Hx C/S G3 for severe unexplained IUGR / breech at 34 wks Elevated BP in office today.   Prenatal Labs: ABO, Rh:   A pos Antibody:   neg Rubella: Immune (03/08 1157)  RPR: Nonreactive (03/08 1157)  HBsAg: Negative (03/08 1157)  HIV: Non-reactive (03/08 1157)  GBS: Negative/-- (03/08 1157)  1 hr Glucola : 152, 3GTT passed Genetic Screening: declined Ultrasound: normal anatomy, anterior placenta. 29 wks growth EFW 52% Declined flu/TdaP    Maternal Diabetes: No Genetic Screening: Declined Maternal Ultrasounds/Referrals: Normal Fetal Ultrasounds or other Referrals:  None Maternal Substance Abuse:  No Significant Maternal Medications:  None Significant Maternal Lab Results:  Group B Strep negative Other Comments:  None  Medical / Surgical History :  Past medical history:  Past Medical History:  Diagnosis Date  . Medical history non-contributory      Past surgical history:  Past Surgical History:  Procedure Laterality Date  . CESAREAN SECTION N/A 08/05/2017   Procedure: Primary CESAREAN SECTION;  Surgeon: Olivia Mackie, MD;  Location: [facility] BIRTHING SUIT[facility];  Service: Obstetrics;  Laterality: N/A;  EDD:  09/17/17 Allergy: Codeine  . wart removed    . WISDOM TOOTH EXTRACTION       Family History: History reviewed. No pertinent family history.   Social History:  reports that she has never smoked. She has never used smokeless tobacco. She reports that she does not drink alcohol or use drugs.   Allergies: Codeine   Current Medications at time of admission:  Medications Prior to Admission  Medication Sig Dispense Refill Last Dose  . Prenatal Vit-Fe Fumarate-FA (PRENATAL MULTIVITAMIN) TABS tablet Take 1 tablet by mouth at bedtime.   01/01/2020 at Unknown time  . Probiotic Product (PROBIOTIC PO) Take 1 capsule by mouth daily.    01/01/2020 at Unknown time     Review of Systems: ROS As noted above  Physical Exam: Vital signs and nursing notes reviewed.  Vitals with BMI 01/02/2020 11/11/2018 08/07/2017  Height 5\' 6"  - -  Weight - - -  BMI - - -  Systolic - 107 122  Diastolic - 64 76  Pulse - 91 75    General: AAO x 3, NAD, coping well, breathing hard through ctx Heart: RRR Lungs:CTAB Abdomen: Gravid, NT Extremities: trace edema Genitalia / VE: deferred   FHR: 120 BPM, mod variability, + accels, no decels TOCO: Ctx q2-3 min, palp mod/strong  Labs:   Pending T&S, CBC, RPR, CMP. PCR No results for input(s): WBC, HGB, HCT, PLT in the last 72 hours.     Assessment:  34 y.o. 34 at [redacted]w[redacted]d Desires TOLAC after counseling for R/B of VBAC Elevated BP in office today, no proteinuria, no neural sx  1. Active 1st stage of labor 2.  FHR category 1 3. GBS neg 4. Desires unmedicated birth 5. Breastfeeding 6. Placenta disposal per patient request  Plan:  1. Admit to BS 2. Routine L&D orders, PEC labs 3. Analgesia/anesthesia PRN  4. Expectant management 5. Anticipate NSVB   Dr Pamala Hurry notified of admission / plan of care   [place], MSN 01/02/2020, 12:17 PM

## 2020-01-02 NOTE — Progress Notes (Signed)
Patient ID: Cynthia Briggs, female   DOB: [birth year], 34 y.o.   MRN: [id number] See CNM Paul's delivery noted.  I was called by Helayne Seminole to assess pt with suspected 4th degree laceration and assist with repair.  Exam c/w 3C 3rd degree perineal laceration.  Rectovaginal exam confirmed intact rectal mucosa. Gloves changed. Wound cleaned thoroughly with betadine.  Patient counseled. Total 30 cc 1% lidocaine infiltration done (20 by CNM and 10 cc by when I started repair).  Anal sphincter torn ring pulled together with Allis clamps. Using 2-0 Vicryl, sphincter repaired in 4 stitches. Then using 2-0 Vicryl subcutaneous tissue approximated in the deeper layer with interrupted sutures. Then using 3-0 Vicryl rapide vaginal mucosa followed by perineal body and superficial subcutaneous area approximated in running interlocking fashion. Then skin approximated in subcuticular fashion. Rectal exam performed again, no suture felt through rectal mucosa and sphincter noted with good tone, anal reflex normal.  Pt was given 30 mg Toradol IM, then 1 mg Stadol IM. She was also given additional 10 units pitocin IM for ongoing trickling bleeding from uterus with lower segment filling up with clots.  EBL 650 cc.  Pt tolerated procedure well.   --V.Juliene Pina, MD

## 2020-01-02 NOTE — Lactation Note (Signed)
This note was copied from a baby's chart. Lactation Consultation Note  Patient Name: [name] [id number] Date: 01/02/2020 Reason for consult: Initial assessment;Term  P4 mother whose infant is now 74 hours old.  This is a term baby at 41+2 weeks.  Mother breast fed her other children ( now 71 , 75 and 33 years old) for 2 years each.  Mother had no questions/concerns related to breast feeding.  She did not wish to review cues or hand expression.  Mother is very tired and the RN just put a "Do Not Disturb" sign on her door.  Briefly discussed breast feeding basics with her.  Encouraged to feed 8-12 times/24 hours or sooner if baby shows feeding cues.  Colostrum container provided and milk storage times reviewed.  Finger feeding demonstrated.    Mother does not have a DEBP for home use.  She may desire one this time because she will be busy with her other children as well as the newborn.  She will contact her insurance company to determine pump eligibility.  Father present.  Mom made aware of O/P services, breastfeeding support groups, community resources, and our phone # for post-discharge questions. Mother does not feel like she needs any lactation assistance, however, was kind enough to inform me that we can "check in" tomorrow if we desire.     Maternal Data Formula Feeding for Exclusion: No Has patient been taught Hand Expression?: Yes Does the patient have breastfeeding experience prior to this delivery?: Yes  Feeding Feeding Type: Breast Fed  LATCH Score Latch: Grasps breast easily, tongue down, lips flanged, rhythmical sucking.  Audible Swallowing: Spontaneous and intermittent  Type of Nipple: Everted at rest and after stimulation  Comfort (Breast/Nipple): Soft / non-tender  Hold (Positioning): No assistance needed to correctly position infant at breast.  LATCH Score: 10  Interventions    Lactation Tools Discussed/Used WIC Program: No   Consult Status Consult  Status: Follow-up Date: 01/03/20 Follow-up type: In-patient    Dora Sims 01/02/2020, 4:29 PM

## 2020-01-02 NOTE — MAU Note (Signed)
Covid swab collected. Pt tolerated well. asymptomatic

## 2020-01-03 LAB — RPR: RPR Ser Ql: NONREACTIVE

## 2020-01-03 MED ORDER — COCONUT OIL OIL: 1.0000 "application " | TOPICAL_OIL | 0 refills | Status: AC | PRN

## 2020-01-03 MED ORDER — IBUPROFEN 600 MG PO TABS: 600.0000 mg | ORAL_TABLET | Freq: Four times a day (QID) | ORAL | 0 refills | Status: AC

## 2020-01-03 MED ORDER — POLYSACCHARIDE IRON COMPLEX 150 MG PO CAPS: 150.0000 mg | ORAL_CAPSULE | Freq: Every day | ORAL | 3 refills | Status: AC

## 2020-01-03 MED ORDER — NIFEDIPINE ER 30 MG PO TB24: 30.0000 mg | ORAL_TABLET | Freq: Every day | ORAL | 0 refills | Status: AC

## 2020-01-03 MED ORDER — DOCUSATE SODIUM 100 MG PO CAPS: 100.0000 mg | ORAL_CAPSULE | Freq: Two times a day (BID) | ORAL | 2 refills | Status: AC

## 2020-01-03 MED ORDER — NIFEDIPINE ER OSMOTIC RELEASE 30 MG PO TB24
30.0000 mg | ORAL_TABLET | Freq: Every day | ORAL | Status: DC
  Administered 2020-01-03: 30 mg via ORAL
  Filled 2020-01-03: qty 1

## 2020-01-03 MED ORDER — MAGNESIUM OXIDE 400 (241.3 MG) MG PO TABS
400.0000 mg | ORAL_TABLET | Freq: Every day | ORAL | Status: DC
  Administered 2020-01-03: 11:00:00 400 mg via ORAL
  Filled 2020-01-03: qty 1

## 2020-01-03 MED ORDER — POLYSACCHARIDE IRON COMPLEX 150 MG PO CAPS
150.0000 mg | ORAL_CAPSULE | Freq: Every day | ORAL | Status: DC
  Administered 2020-01-03: 150 mg via ORAL
  Filled 2020-01-03: qty 1

## 2020-01-03 MED ORDER — BENZOCAINE-MENTHOL 20-0.5 % EX AERO: 1.0000 "application " | INHALATION_SPRAY | CUTANEOUS | 0 refills | Status: AC | PRN

## 2020-01-03 MED ORDER — MAGNESIUM OXIDE 400 (241.3 MG) MG PO TABS: 400.0000 mg | ORAL_TABLET | Freq: Every day | ORAL | 3 refills | Status: AC

## 2020-01-03 NOTE — Progress Notes (Addendum)
PPD #1 SVD/VBAC, 3rd degree repair, baby girl "Cynthia Briggs"  S:  Reports feeling well. Desires early d/c home at 24 hours due to 3 other children at home. Denies HA, visual changes, RUQ/epigastric pain. Minimal discomfort with 3rd degree - pain well controlled with Motrin and Dermoplast spray              Tolerating po/ No nausea or vomiting / Denies dizziness or SOB             Bleeding is moderate             Up ad lib / ambulatory / voiding QS without difficulty   Newborn breast feeding - going well  O:               VS: BP (!) 144/92 Comment: CNM Meridth Tanis Burnley is notified  Pulse 68   Temp 98.4 F (36.9 C) (Oral)   Resp 18   Ht 5\' 6"  (1.676 m)   Breastfeeding Unknown   BMI 25.50 kg/m  01/03/20 0910  --  68  --  --  144/92Abnormal   --  --  --  --  --  -- BM   01/03/20 0747  98.4 F (36.9 C)  68  --  18  136/89  --  --  --  --  --  -- KO   01/02/20 2345  98.3 F (36.8 C)  68  --  18  144/78Abnormal   --  --  --  --  --  -- KO   01/02/20 2134  99.8 F (37.7 C)  65  --  18  126/77  --  --  --  --  --  -- KO   01/02/20 1645  97.7 F (36.5 C)  59Abnormal   --  19  134/89  Semi-fowlers  --  --  --  --  -- ER   01/02/20 1545  98 F (36.7 C)  63  --  17  136/91Abnormal   Semi-fowlers  --  --  --  --  -- ER   01/02/20 1516  --  65  --  --  138/85  --  --  --  --  --  -- AD   01/02/20 1501  --  66  --  --  142/90Abnormal   --  --  --  --  --  -- AD   01/02/20 1455  --  65  --  --  140/90  --  --  --  --  --  -- AD   01/02/20 1431  --  66  --  18  151/88Abnormal   --  --  --  --  --  -- AD   01/02/20 1416  --  63  --  16  140/81  --  --  --  --  --  -- AD   01/02/20 1401  --  70  --  18  134/85  --  --  --  --  --  -- AD   01/02/20 1347  --  74  --  16  141/73Abnormal   --  --  --  --  --  -- AD      LABS:              Recent Labs    01/02/20 1214 01/03/20 0516  WBC 9.2 9.2  HGB 12.0 9.2*  PLT 251 182  Blood type: --/--/A POS, A POS Performed at [facility],  [address] [place], [place] [address]  [phone]/08 1214)  Rubella: Immune (03/08 1157)                     I&O: Intake/Output      03/08 0701 - 03/09 0700 03/09 0701 - 03/10 0700   Blood 616    Total Output 616    Net -616                       Physical Exam:             Alert and oriented X3  Lungs: Clear and unlabored  Heart: regular rate and rhythm / no murmurs  Abdomen: soft, non-tender, non-distended              Fundus: firm, non-tender, U-1  Perineum: well approximated 3rd degree repair, mild edema, ecchymosis, small, superficial hematoma noted on right labia extending down to rectum, but no evidence in the vagina  Lochia: small rubra noted on pad  Extremities: +1 LE edema, no calf pain or tenderness, varicosities noted on left calf  A/P: PPD # 1, SVD/VBAC  3rd degree repair with ecchymosis and small hematoma   - Warm water sitz baths 2-3 times per day    - Continue ice packs PRN and Dermoplast spray   - Bowel regimen: Colace 100mg  BID and Magnesium oxide 400mg  daily  ABL Anemia   - Continue oral FE daily x [redacted] weeks   Gestational hypertension with postpartum exacerbation    -  Begin Procardia 30mg  XL daily   -  Pt. Requesting discharge, despite mild range BPs and starting antihypertensives; wants to get home to other 3 children   - PEC warning s/s reviewed   - Husband is a and will plan to check BPs daily   - Plan for close f/u in office on Friday for BP check and perineal eval    POC in consult with Dr. , MSN, CNM [facility]

## 2020-01-03 NOTE — Lactation Note (Signed)
This note was copied from a baby's chart. Lactation Consultation Note  Patient Name: Cynthia Briggs [id number] Date: 01/03/2020 Reason for consult: Follow-up assessment   LC Follow Up Visit:  Mother has a "Do Not Disturb" sign on her door; will have RN call me when mother is available to visit with me.   Consult Status Consult Status: Follow-up Date: 01/03/20 Follow-up type: In-patient    Robel Wuertz R Breanna Shorkey 01/03/2020, 8:21 AM

## 2020-01-03 NOTE — Discharge Summary (Signed)
Obstetric Discharge Summary   Patient Name: Cynthia Briggs DOB: July 11, 1986 MRN: [id number]  Date of Admission: 01/02/2020 Date of Discharge: 01/03/2020 Date of Delivery: 01/02/2020 Gestational Age at Delivery: [redacted]w[redacted]d  Primary OB: [facility] - CNM management   Antepartum complications:  Hx C/S G3 for severe unexplained IUGR / breech at [redacted] wks Gestational hypertension  Prenatal labs: ABO, Rh:   A pos Antibody:   neg Rubella: Immune (03/08 1157)  RPR: Nonreactive (03/08 1157)  HBsAg: Negative (03/08 1157)  HIV: Non-reactive (03/08 1157)  GBS: Negative/-- (03/08 1157)  1 hr Glucola : 152, 3GTT passed Genetic Screening: declined Ultrasound: normal anatomy, anterior placenta. 29 wks growth EFW 52% Declined flu/TdaP  Admitting Diagnosis: 41+2 weeks labor, TOLAC  Secondary Diagnoses: Patient Active Problem List   Diagnosis Date Noted  . Indication for care in labor or delivery 01/02/2020  . TOLAC 01/02/2020  . Normal labor 01/02/2020  . VBAC, delivered 01/02/2020  . Third degree perineal laceration 01/02/2020  . Postpartum care following VBAC (3/8) 01/02/2020    Augmentation: None  Date of Delivery: 01/02/2020 Delivered By: Arlan Organ, CNM  Delivery Type: vaginal birth after cesarean (VBAC) Anesthesia: none Placenta: spontaneous Laceration: 3rd degree perineal  Episiotomy: none  Newborn Data: Live born female  Birth Weight: 9 lb 3 oz (4167 g) APGAR: 9, 9  Newborn Delivery   Birth date/time: 01/02/2020 12:57:00 Delivery type: VBAC, Spontaneous     Hospital/Postpartum Course  (Vaginal Delivery):  Pt. Admitted at 41+2 weeks in labor, planning TOLAC.  She had mild range BPs in office earlier that morning, with labile BPs that persisted throughout labor consistent with gestational hypertension. She desired unmedicated, low intervention birth.  She progressed spontaneously with SROM prior to delivery.  She delivered by SVD/VBAC.  Her delivery was complicated by a 3rd  degree perineal laceration repaired by Dr. Juliene Pina.  See notes and delivery summary for details. Her postpartum course was complicated by labile mild range BPs, but asymptomatic.  We started her on Procardia 30mg  XL daily.  She also had a small hematoma noted on right labia. She requested discharged home at 24 hours due to 3 other children at home, and will plan to continue medication and watch for s/s of PEC.  Her husband is a and will plan to check her BPs and help her monitor for symptoms.  By time of discharge on PPD#1, her pain was controlled on oral pain medications; she had appropriate lochia and was ambulating, voiding without difficulty and tolerating regular diet. She will plan for close f/u on Friday for BP and perineal repair check.   Labs: CBC Latest Ref Rng & Units 01/03/2020 01/02/2020 08/06/2017  WBC 4.0 - 10.5 K/uL 9.2 9.2 16.3(H)  Hemoglobin 12.0 - 15.0 g/dL 10/06/2017) 4.0(J 81.1  Hematocrit 36.0 - 46.0 % 27.7(L) 35.8(L) 35.3(L)  Platelets 150 - 400 K/uL 182 251 255   Conflict (See Lab Report): A POS/A POS Performed at [facility], [address] [place], [place]   Physical exam:  BP (!) (P) 145/91   Pulse (P) 63   Temp 98.4 F (36.9 C) (Oral)   Resp (P) 18   Ht 5\' 6"  (1.676 m)   Breastfeeding Unknown   BMI 25.50 kg/m   Alert and oriented X3             Lungs: Clear and unlabored             Heart: regular rate and rhythm / no  murmurs             Abdomen: soft, non-tender, non-distended              Fundus: firm, non-tender, U-1             Perineum: well approximated 3rd degree repair, mild edema, ecchymosis, small, superficial hematoma noted on right labia extending down to rectum, but no evidence in the vagina             Lochia: small rubra noted on pad             Extremities: +1 LE edema, no calf pain or tenderness, varicosities noted on right calf  Disposition: stable, discharge to home Baby Feeding: breast milk Baby Disposition: home with  mom  Rh Immune globulin given: N/A Rubella vaccine given: N/A Tdap vaccine given in AP or PP setting: declined Flu vaccine given in AP or PP setting: declined    Plan:  Cynthia Briggs was discharged to home in good condition. Follow-up appointment at [facility] in 3 days.   Discharge Instructions: Per After Visit Summary. Refer to After Visit Summary and [facility] discharge booklet  Activity: Advance as tolerated. Pelvic rest for 6 weeks.   Diet: Regular, Heart Healthy Discharge Medications: Allergies as of 01/03/2020      Reactions   Codeine Nausea And Vomiting   Pt unsure if the vomiting was due to the codiene or anesthesia from wisdom teeth removal      Medication List    TAKE these medications   benzocaine-Menthol 20-0.5 % Aero Commonly known as: DERMOPLAST Apply 1 application topically as needed for irritation (perineal discomfort).   coconut oil Oil Apply 1 application topically as needed.   docusate sodium 100 MG capsule Commonly known as: Colace Take 1 capsule (100 mg total) by mouth 2 (two) times daily.   ibuprofen 600 MG tablet Commonly known as: ADVIL Take 1 tablet (600 mg total) by mouth every 6 (six) hours.   iron polysaccharides 150 MG capsule Commonly known as: NIFEREX Take 1 capsule (150 mg total) by mouth daily.   magnesium oxide 400 (241.3 Mg) MG tablet Commonly known as: MAG-OX Take 1 tablet (400 mg total) by mouth daily.   NIFEdipine 30 MG 24 hr tablet Commonly known as: ADALAT CC Take 1 tablet (30 mg total) by mouth daily.   prenatal multivitamin Tabs tablet Take 1 tablet by mouth at bedtime.   PROBIOTIC PO Take 1 capsule by mouth daily.            Discharge Care Instructions  (From admission, onward)         Start     Ordered   01/03/20 0000  Discharge wound care:    Comments: Warm water sitz baths 2-3 times per day as needed   01/03/20 1101         Outpatient follow up:  Follow-up Information    Juliene Pina, CNM. Schedule an appointment as soon as possible for a visit on 01/06/2020.   Specialty: Obstetrics and Gynecology Why: BP and repair check  Contact information: [address] [place] [phone]           Signed:  Lars Pinks, MSN, CNM [facility]

## 2020-01-04 LAB — CBC
HCT: 27.7 % — ABNORMAL LOW (ref 36.0–46.0)
Hemoglobin: 9.2 g/dL — ABNORMAL LOW (ref 12.0–15.0)
MCH: 29.1 pg (ref 26.0–34.0)
MCHC: 33.2 g/dL (ref 30.0–36.0)
MCV: 87.7 fL (ref 80.0–100.0)
Platelets: 182 10*3/uL (ref 150–400)
RBC: 3.16 MIL/uL — ABNORMAL LOW (ref 3.87–5.11)
RDW: 12.2 % (ref 11.5–15.5)
WBC: 9.2 10*3/uL (ref 4.0–10.5)
nRBC: 0 % (ref 0.0–0.2)
